# Patient Record
Sex: Female | Born: 1990 | Race: Black or African American | Hispanic: No | Marital: Single | Smoking: Current every day smoker
Health system: Southern US, Community
[De-identification: ages and names within clinical notes are randomized; demographics above are authoritative.]

## PROBLEM LIST (undated history)

## (undated) ENCOUNTER — Inpatient Hospital Stay (HOSPITAL_COMMUNITY): Payer: Self-pay

## (undated) DIAGNOSIS — J189 Pneumonia, unspecified organism: Secondary | ICD-10-CM

## (undated) DIAGNOSIS — J40 Bronchitis, not specified as acute or chronic: Secondary | ICD-10-CM

## (undated) DIAGNOSIS — J45909 Unspecified asthma, uncomplicated: Secondary | ICD-10-CM

## (undated) DIAGNOSIS — J302 Other seasonal allergic rhinitis: Secondary | ICD-10-CM

## (undated) HISTORY — PX: NO PAST SURGERIES: SHX2092

---
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
## 2008-01-12 ENCOUNTER — Emergency Department (HOSPITAL_COMMUNITY): Admission: EM | Admit: 2008-01-12 | Discharge: 2008-01-12 | Payer: Self-pay | Admitting: Family Medicine

## 2008-03-19 ENCOUNTER — Emergency Department (HOSPITAL_COMMUNITY): Admission: EM | Admit: 2008-03-19 | Discharge: 2008-03-19 | Payer: Self-pay | Admitting: Emergency Medicine

## 2008-05-14 ENCOUNTER — Emergency Department (HOSPITAL_COMMUNITY): Admission: EM | Admit: 2008-05-14 | Discharge: 2008-05-14 | Payer: Self-pay | Admitting: Emergency Medicine

## 2008-06-17 ENCOUNTER — Emergency Department (HOSPITAL_COMMUNITY): Admission: EM | Admit: 2008-06-17 | Discharge: 2008-06-17 | Payer: Self-pay | Admitting: Family Medicine

## 2008-11-09 ENCOUNTER — Emergency Department (HOSPITAL_COMMUNITY): Admission: EM | Admit: 2008-11-09 | Discharge: 2008-11-09 | Payer: Self-pay | Admitting: Family Medicine

## 2008-12-31 ENCOUNTER — Emergency Department (HOSPITAL_COMMUNITY): Admission: EM | Admit: 2008-12-31 | Discharge: 2008-12-31 | Payer: Self-pay | Admitting: Family Medicine

## 2009-07-22 ENCOUNTER — Emergency Department (HOSPITAL_COMMUNITY): Admission: EM | Admit: 2009-07-22 | Discharge: 2009-07-22 | Payer: Self-pay | Admitting: Emergency Medicine

## 2010-04-16 LAB — CULTURE, ROUTINE-ABSCESS

## 2010-04-24 LAB — POCT RAPID STREP A (OFFICE): Streptococcus, Group A Screen (Direct): NEGATIVE

## 2010-04-26 LAB — URINALYSIS, ROUTINE W REFLEX MICROSCOPIC
Glucose, UA: NEGATIVE mg/dL
Hgb urine dipstick: NEGATIVE
Protein, ur: NEGATIVE mg/dL
Urobilinogen, UA: 1 mg/dL (ref 0.0–1.0)

## 2010-10-19 LAB — POCT RAPID STREP A: Streptococcus, Group A Screen (Direct): NEGATIVE

## 2011-10-29 ENCOUNTER — Encounter (HOSPITAL_COMMUNITY): Payer: Self-pay | Admitting: Emergency Medicine

## 2011-10-29 ENCOUNTER — Emergency Department (INDEPENDENT_AMBULATORY_CARE_PROVIDER_SITE_OTHER)
Admission: EM | Admit: 2011-10-29 | Discharge: 2011-10-29 | Disposition: A | Discharge status: Home / Self Care | Payer: Self-pay | Source: Home / Self Care | Attending: Family Medicine | Admitting: Family Medicine

## 2011-10-29 DIAGNOSIS — Z720 Tobacco use: Secondary | ICD-10-CM

## 2011-10-29 DIAGNOSIS — J410 Simple chronic bronchitis: Secondary | ICD-10-CM

## 2011-10-29 HISTORY — DX: Other seasonal allergic rhinitis: J30.2

## 2011-10-29 HISTORY — DX: Bronchitis, not specified as acute or chronic: J40

## 2011-10-29 MED ORDER — AMOXICILLIN-POT CLAVULANATE 875-125 MG PO TABS: 1.0000 | ORAL_TABLET | Freq: Two times a day (BID) | ORAL | Status: DC

## 2011-10-29 MED ORDER — DEXTROMETHORPHAN POLISTIREX 30 MG/5ML PO LQCR: 60.0000 mg | Freq: Two times a day (BID) | ORAL | Status: DC

## 2011-10-29 NOTE — ED Notes (Signed)
C/o sore throat, difficulty swallowing, sniffles -started yesterday.  Patient reports a coughing episode followed by blood streaks in emesis.

## 2011-10-29 NOTE — ED Provider Notes (Signed)
History     CSN: [id number]  Arrival date & time 10/29/11  1607   First MD Initiated Contact with Patient 10/29/11 1748      Chief Complaint  Patient presents with  . URI    (Consider location/radiation/quality/duration/timing/severity/associated sxs/prior treatment) Patient is a 21 y.o. female presenting with URI. The history is provided by the patient.  URI The primary symptoms include cough and nausea. Primary symptoms do not include fever or rash. The current episode started yesterday. This is a new problem. The problem has not changed since onset. Associated with: smoking. Symptoms associated with the illness include congestion and rhinorrhea.    Past Medical History  Diagnosis Date  . Seasonal allergies   . Bronchitis     History reviewed. No pertinent past surgical history.  No family history on file.  History  Substance Use Topics  . Smoking status: Current Every Day Smoker  . Smokeless tobacco: Not on file  . Alcohol Use: Yes    OB History    Grav Para Term Preterm Abortions TAB SAB Ect Mult Living                  Review of Systems  Constitutional: Negative.  Negative for fever.  HENT: Positive for congestion, rhinorrhea and postnasal drip.   Respiratory: Positive for cough.   Cardiovascular: Negative.   Gastrointestinal: Positive for nausea.  Genitourinary: Negative.   Musculoskeletal: Negative.   Skin: Negative for rash.    Allergies  Review of patient's allergies indicates no known allergies.  Home Medications   Current Outpatient Rx  Name Route Sig Dispense Refill  . AMOXICILLIN-POT CLAVULANATE 875-125 MG PO TABS Oral Take 1 tablet by mouth 2 (two) times daily. 20 tablet 0  . DEXTROMETHORPHAN POLISTIREX ER 30 MG/5ML PO LQCR Oral Take 10 mLs (60 mg total) by mouth 2 (two) times daily. 89 mL 0    BP 122/76  Pulse 77  Temp 98.2 F (36.8 C) (Oral)  Resp 18  SpO2 100%  LMP 10/22/2011  Physical Exam  Nursing note and vitals  reviewed. Constitutional: She is oriented to person, place, and time. She appears well-developed and well-nourished.  HENT:  Head: Normocephalic.  Right Ear: External ear normal.  Left Ear: External ear normal.  Nose: Mucosal edema and rhinorrhea present.  Mouth/Throat: Oropharynx is clear and moist.  Neck: Normal range of motion. Neck supple.  Cardiovascular: Normal rate, regular rhythm, normal heart sounds and intact distal pulses.   Pulmonary/Chest: Effort normal. She has no decreased breath sounds. She has no wheezes. She has rhonchi. She has no rales.  Lymphadenopathy:    She has no cervical adenopathy.  Neurological: She is alert and oriented to person, place, and time.  Skin: Skin is warm and dry.  Psychiatric: She has a normal mood and affect.    ED Course  Procedures (including critical care time)  Labs Reviewed - No data to display No results found.   1. Bronchitis due to tobacco use       MDM          Linna Hoff, MD 10/29/11 1815

## 2011-12-25 ENCOUNTER — Encounter (HOSPITAL_COMMUNITY): Payer: Self-pay | Admitting: *Deleted

## 2011-12-25 ENCOUNTER — Inpatient Hospital Stay (HOSPITAL_COMMUNITY)
Admission: AD | Admit: 2011-12-25 | Discharge: 2011-12-25 | Disposition: A | Discharge status: Home / Self Care | Payer: Self-pay | Source: Ambulatory Visit | Attending: Obstetrics & Gynecology | Admitting: Obstetrics & Gynecology

## 2011-12-25 ENCOUNTER — Encounter (HOSPITAL_COMMUNITY): Payer: Self-pay

## 2011-12-25 ENCOUNTER — Inpatient Hospital Stay (HOSPITAL_COMMUNITY): Payer: Self-pay

## 2011-12-25 ENCOUNTER — Emergency Department (INDEPENDENT_AMBULATORY_CARE_PROVIDER_SITE_OTHER)
Admission: EM | Admit: 2011-12-25 | Discharge: 2011-12-25 | Disposition: A | Discharge status: Home / Self Care | Payer: Self-pay | Source: Home / Self Care | Attending: Family Medicine | Admitting: Family Medicine

## 2011-12-25 ENCOUNTER — Other Ambulatory Visit (HOSPITAL_COMMUNITY)
Admission: RE | Admit: 2011-12-25 | Discharge: 2011-12-25 | Disposition: A | Discharge status: Home / Self Care | Payer: Self-pay | Source: Ambulatory Visit | Attending: Family Medicine | Admitting: Family Medicine

## 2011-12-25 DIAGNOSIS — R109 Unspecified abdominal pain: Secondary | ICD-10-CM | POA: Insufficient documentation

## 2011-12-25 DIAGNOSIS — O200 Threatened abortion: Secondary | ICD-10-CM

## 2011-12-25 DIAGNOSIS — O209 Hemorrhage in early pregnancy, unspecified: Secondary | ICD-10-CM

## 2011-12-25 DIAGNOSIS — Z3490 Encounter for supervision of normal pregnancy, unspecified, unspecified trimester: Secondary | ICD-10-CM

## 2011-12-25 DIAGNOSIS — O26859 Spotting complicating pregnancy, unspecified trimester: Secondary | ICD-10-CM | POA: Insufficient documentation

## 2011-12-25 DIAGNOSIS — Z113 Encounter for screening for infections with a predominantly sexual mode of transmission: Secondary | ICD-10-CM | POA: Insufficient documentation

## 2011-12-25 LAB — CBC
HCT: 37.6 % (ref 36.0–46.0)
Hemoglobin: 12.5 g/dL (ref 12.0–15.0)
MCH: 30.7 pg (ref 26.0–34.0)
MCHC: 33.2 g/dL (ref 30.0–36.0)
MCV: 92.4 fL (ref 78.0–100.0)
Platelets: 339 10*3/uL (ref 150–400)
RBC: 4.07 MIL/uL (ref 3.87–5.11)
RDW: 12.2 % (ref 11.5–15.5)
WBC: 12.2 10*3/uL — ABNORMAL HIGH (ref 4.0–10.5)

## 2011-12-25 LAB — POCT PREGNANCY, URINE: Preg Test, Ur: POSITIVE — AB

## 2011-12-25 LAB — POCT URINALYSIS DIP (DEVICE)
Ketones, ur: NEGATIVE mg/dL
Nitrite: NEGATIVE
pH: 6 (ref 5.0–8.0)

## 2011-12-25 NOTE — ED Provider Notes (Signed)
History     CSN: [id number]  Arrival date & time 12/25/11  1111   First MD Initiated Contact with Patient 12/25/11 1135      Chief Complaint  Patient presents with  . Abdominal Cramping    (Consider location/radiation/quality/duration/timing/severity/associated sxs/prior treatment) HPI Comments: 21 year old, smoker female G1 P0. Denies prior history of sexually transmitted diseases. Here complaining of low abdominal cramping for 3 days. Symptoms associated with slight blood spotting since yesterday. Reports pain is mild to moderate 4-5/10. Not taking any medications for her symptoms. No dysuria or hematuria. Denies vaginal discharge. No fever or chills. No dizziness or headache. No vomiting. Reports history of irregular periods last menstrual period was mid November as per patient's recollection.   Past Medical History  Diagnosis Date  . Seasonal allergies   . Bronchitis     History reviewed. No pertinent past surgical history.  No family history on file.  History  Substance Use Topics  . Smoking status: Current Every Day Smoker  . Smokeless tobacco: Not on file  . Alcohol Use: Yes    OB History    Grav Para Term Preterm Abortions TAB SAB Ect Mult Living                  Review of Systems  Constitutional: Negative for fever, chills and fatigue.  Gastrointestinal: Positive for nausea. Negative for vomiting.  Genitourinary: Positive for vaginal bleeding and pelvic pain. Negative for dysuria, flank pain and vaginal discharge.  Neurological: Negative for dizziness.    Allergies  Review of patient's allergies indicates no known allergies.  Home Medications   Current Outpatient Rx  Name  Route  Sig  Dispense  Refill  . AMOXICILLIN-POT CLAVULANATE 875-125 MG PO TABS   Oral   Take 1 tablet by mouth 2 (two) times daily.   20 tablet   0   . DEXTROMETHORPHAN POLISTIREX ER 30 MG/5ML PO LQCR   Oral   Take 10 mLs (60 mg total) by mouth 2 (two) times daily.   89 mL   0     BP 122/72  Pulse 103  Temp 98.3 F (36.8 C) (Oral)  Resp 20  SpO2 100%  LMP 11/19/2011  Physical Exam  Nursing note and vitals reviewed. Constitutional: She is oriented to person, place, and time. She appears well-developed and well-nourished. No distress.       Appears comfortable. Able to transfer from chair to table with no obvious discomfort.  Neck: No thyromegaly present.  Cardiovascular: Normal heart sounds.        Mild tachycardia heart rate 90s to 103  Pulmonary/Chest: Effort normal and breath sounds normal. No respiratory distress.  Abdominal: Soft. She exhibits no distension. There is no rebound and no guarding.  Genitourinary:    Uterus is enlarged. Cervix exhibits no motion tenderness and no discharge. Right adnexum displays no mass, no tenderness and no fullness. Left adnexum displays no mass, no tenderness and no fullness. There is bleeding around the vagina. No vaginal discharge found.    Neurological: She is alert and oriented to person, place, and time.  Skin: She is not diaphoretic.    ED Course  Procedures (including critical care time)  Labs Reviewed  POCT PREGNANCY, URINE - Abnormal; Notable for the following:    Preg Test, Ur POSITIVE (*)     All other components within normal limits  POCT URINALYSIS DIP (DEVICE)   No results found.   1. First trimester bleeding  MDM  21 year old female G1 P0. Here with a positive pregnancy test and first trimester vaginal bleeding. Discussed with patient concerned about ectopic pregnancy versus other causes for early pregnancy bleeding. Patient is mildly tachycardic otherwise her blood pressure is stable. She was instructed to go to [facility] hospital from here. Case was discussed with nurse practitioner at maternity admission unit. GC and Chlamydia pending at the time of discharge.      Sharin Grave, MD 12/25/11 1333

## 2011-12-25 NOTE — Progress Notes (Signed)
Pt's  standing outside room door waiting for discharge.Pt  Informed that provider waiting for report.

## 2011-12-25 NOTE — MAU Note (Signed)
Patient states she was seen at Urgent Care today for cramping and spotting. Had a positive pregnancy test and sent to MAU for evaluation. Now having bad cramping and an increase in bleeding since the pelvic exam.

## 2011-12-25 NOTE — ED Notes (Signed)
Pt  Reports  Symptoms  Of  Low  abd  Cramping    Had        Slight   Blood  Tinged               Discharge         yest            She  Ambulated  Upright  With a  Steady  Fluid  Gait

## 2011-12-25 NOTE — MAU Provider Note (Signed)
History     CSN: [id number]  Arrival date and time: 12/25/11 1355   None     Chief Complaint  Patient presents with  . Abdominal Cramping   HPI This is a 21 y.o. female at [redacted]w[redacted]d by LMP who presents with c/o bleeding and cramps. Was seen at Urgent Care for same. Pelvic and cultures were done. She was sent here for [place] and bloodwork. Not sure she wants to continue pregnancy. Was using condoms and [facility]   OB History    Grav Para Term Preterm Abortions TAB SAB Ect Mult Living   1               Past Medical History  Diagnosis Date  . Seasonal allergies   . Bronchitis     Past Surgical History  Procedure Date  . No past surgeries     History reviewed. No pertinent family history.  History  Substance Use Topics  . Smoking status: Former Smoker    Quit date: 09/25/2011  . Smokeless tobacco: Former Neurosurgeon    Quit date: 11/25/2011  . Alcohol Use: 0.6 oz/week    1 Cans of beer per week    Allergies: No Known Allergies  No prescriptions prior to admission    ROS See HPI  Physical Exam   Blood pressure 119/84, pulse 105, temperature 98.1 F (36.7 C), temperature source Oral, resp. rate 16, height 5\' 5"  (1.651 m), weight 114 lb 3.2 oz (51.801 kg), last menstrual period 11/19/2011, SpO2 100.00%.  Physical Exam  Constitutional: She is oriented to person, place, and time. She appears well-developed and well-nourished.  Cardiovascular: Normal rate.   Respiratory: Effort normal.  GI: Soft. She exhibits no distension. There is no tenderness.  Genitourinary:       Deferred -- done at other center  Musculoskeletal: Normal range of motion.  Neurological: She is alert and oriented to person, place, and time.  Skin: Skin is warm and dry.  Psychiatric: She has a normal mood and affect.   Results for orders placed during the hospital encounter of 12/25/11 (from the past 24 hour(s))  HCG, QUANTITATIVE, PREGNANCY     Status:  Abnormal   Collection Time   12/25/11  2:26 PM      Component Value Range   hCG, Beta Chain, Quant, S 136 (*) <5 mIU/mL  ABO/RH     Status: Normal (Preliminary result)   Collection Time   12/25/11  2:26 PM      Component Value Range   ABO/RH(D) A POS    CBC     Status: Abnormal   Collection Time   12/25/11  2:26 PM      Component Value Range   WBC 12.2 (*) 4.0 - 10.5 K/uL   RBC 4.07  3.87 - 5.11 MIL/uL   Hemoglobin 12.5  12.0 - 15.0 g/dL   HCT 40.9  81.1 - 91.4 %   MCV 92.4  78.0 - 100.0 fL   MCH 30.7  26.0 - 34.0 pg   MCHC 33.2  30.0 - 36.0 g/dL   RDW 78.2  95.6 - 21.3 %   Platelets 339  150 - 400 K/uL     MAU Course  Procedures  MDM Quant, [place] >> As expected, [place] did not show any sign of pregnancy, not unexpected with low quant. Will repeat Quant in 48 hrs .  Requested proof of pregnancy letter "just in case"  Assessment and  Plan  A:  Pregnancy at [redacted]w[redacted]d  By LMP       Spotting and cramping      Cannot rule out ectopic yet  P:  Return in 48 hrs for quant HCG level       Repeat US in about 10 days, at which time, quant should be about 4000.         [facility] 12/25/2011, 2:49 PM

## 2011-12-26 NOTE — MAU Provider Note (Signed)
Attestation of Attending Supervision of Advanced Practitioner (PA/CNM/NP): Evaluation and management procedures were performed by the Advanced Practitioner under my supervision and collaboration.  I have reviewed the Advanced Practitioner's note and chart, and I agree with the management and plan.  Annaleise Burger, MD, FACOG Attending Obstetrician & Gynecologist Faculty Practice, [facility]  

## 2011-12-28 ENCOUNTER — Inpatient Hospital Stay (HOSPITAL_COMMUNITY)
Admission: AD | Admit: 2011-12-28 | Discharge: 2011-12-28 | Disposition: A | Discharge status: Home / Self Care | Payer: Self-pay | Source: Ambulatory Visit | Attending: Obstetrics and Gynecology | Admitting: Obstetrics and Gynecology

## 2011-12-28 ENCOUNTER — Encounter (HOSPITAL_COMMUNITY): Payer: Self-pay

## 2011-12-28 DIAGNOSIS — O039 Complete or unspecified spontaneous abortion without complication: Secondary | ICD-10-CM | POA: Insufficient documentation

## 2011-12-28 LAB — URINALYSIS, ROUTINE W REFLEX MICROSCOPIC: Specific Gravity, Urine: >1.03 — ABNORMAL HIGH (ref 1.005–1.030)

## 2011-12-28 LAB — CBC
MCH: 31 pg (ref 26.0–34.0)
MCV: 92.3 fL (ref 78.0–100.0)
Platelets: 369 10*3/uL (ref 150–400)
RDW: 12.2 % (ref 11.5–15.5)

## 2011-12-28 LAB — URINE MICROSCOPIC-ADD ON

## 2011-12-28 LAB — HCG, QUANTITATIVE, PREGNANCY: hCG, Beta Chain, Quant, S: 30 m[IU]/mL — ABNORMAL HIGH (ref <5)

## 2011-12-28 MED ORDER — IBUPROFEN 200 MG PO TABS: 600.0000 mg | ORAL_TABLET | Freq: Four times a day (QID) | ORAL | Status: DC | PRN

## 2011-12-28 NOTE — MAU Note (Signed)
Pt reports she started having bad abd cramps and also having heavy vag bleeding this morning. Still having some bleeding now. Was seen in MAU a few dayd ago and told  The u/s did not see a pregnancy.

## 2011-12-28 NOTE — MAU Provider Note (Signed)
History     CSN: [id number]  Arrival date and time: 12/28/11 1816   None     Chief Complaint  Patient presents with  . Vaginal Bleeding   HPI 21 y.o. G1P0 at [redacted]w[redacted]d with vaginal bleeding and cramping 2 days ago, increased this morning. U/S here 2 days ago showed no IUP or ectopic, quant 130 at that time.    Past Medical History  Diagnosis Date  . Seasonal allergies   . Bronchitis     Past Surgical History  Procedure Date  . No past surgeries     Family History  Problem Relation Age of Onset  . Other Neg Hx     History  Substance Use Topics  . Smoking status: Former Smoker    Quit date: 09/25/2011  . Smokeless tobacco: Former Neurosurgeon    Quit date: 11/25/2011  . Alcohol Use: 0.6 oz/week    1 Cans of beer per week    Allergies:  Allergies  Allergen Reactions  . Benadryl (Diphenhydramine Hcl) Swelling    Prescriptions prior to admission  Medication Sig Dispense Refill  . HYDROcodone-acetaminophen (VICODIN) 2.5-500 MG per tablet Take 1 tablet by mouth every 6 (six) hours as needed.      Marland Kitchen ibuprofen (ADVIL,MOTRIN) 200 MG tablet Take 600 mg by mouth every 6 (six) hours as needed.        Review of Systems  Constitutional: Negative.   Respiratory: Negative.   Cardiovascular: Negative.   Gastrointestinal: Positive for abdominal pain. Negative for nausea, vomiting, diarrhea and constipation.  Genitourinary: Negative for dysuria, urgency, frequency, hematuria and flank pain.       + vaginal bleeding   Musculoskeletal: Negative.   Neurological: Negative.   Psychiatric/Behavioral: Negative.    Physical Exam   Blood pressure 128/82, pulse 105, temperature 98.2 F (36.8 C), temperature source Oral, resp. rate 18, height 5\' 5"  (1.651 m), weight 114 lb (51.71 kg), last menstrual period 11/19/2011.  Physical Exam  Nursing note and vitals reviewed. Constitutional: She is oriented to person, place, and time. She appears well-developed and well-nourished. No distress.   Cardiovascular: Normal rate.   Respiratory: Effort normal.  GI: Soft. There is no tenderness.  Genitourinary:       Small vaginal bleeding on pad   Musculoskeletal: Normal range of motion.  Neurological: She is alert and oriented to person, place, and time.  Skin: Skin is warm and dry.  Psychiatric: She has a normal mood and affect.    MAU Course  Procedures  Results for orders placed during the hospital encounter of 12/28/11 (from the past 24 hour(s))  URINALYSIS, ROUTINE W REFLEX MICROSCOPIC     Status: Abnormal   Collection Time   12/28/11  7:10 PM      Component Value Range   Color, Urine RED (*) YELLOW   APPearance CLEAR  CLEAR   Specific Gravity, Urine >1.030 (*) 1.005 - 1.030   pH 5.5  5.0 - 8.0   Glucose, UA 100 (*) NEGATIVE mg/dL   Hgb urine dipstick LARGE (*) NEGATIVE   Bilirubin Urine SMALL (*) NEGATIVE   Ketones, ur 15 (*) NEGATIVE mg/dL   Protein, ur >409 (*) NEGATIVE mg/dL   Urobilinogen, UA 1.0  0.0 - 1.0 mg/dL   Nitrite POSITIVE (*) NEGATIVE   Leukocytes, UA TRACE (*) NEGATIVE  URINE MICROSCOPIC-ADD ON     Status: Abnormal   Collection Time   12/28/11  7:10 PM      Component Value Range  Squamous Epithelial / LPF FEW (*) RARE   WBC, UA 3-6  <3 WBC/hpf   RBC / HPF TOO NUMEROUS TO COUNT  <3 RBC/hpf   Bacteria, UA MANY (*) RARE  HCG, QUANTITATIVE, PREGNANCY     Status: Abnormal   Collection Time   12/28/11  7:47 PM      Component Value Range   hCG, Beta Chain, Quant, S 30 (*) <5 mIU/mL  CBC     Status: Abnormal   Collection Time   12/28/11  7:47 PM      Component Value Range   WBC 11.7 (*) 4.0 - 10.5 K/uL   RBC 4.03  3.87 - 5.11 MIL/uL   Hemoglobin 12.5  12.0 - 15.0 g/dL   HCT 16.1  09.6 - 04.5 %   MCV 92.3  78.0 - 100.0 fL   MCH 31.0  26.0 - 34.0 pg   MCHC 33.6  30.0 - 36.0 g/dL   RDW 40.9  81.1 - 91.4 %   Platelets 369  150 - 400 K/uL     Assessment and Plan   1. Miscarriage   Quant HCG decreased from 130 to 30 in 3 days. Rev'd  precautions. F/U 1 week for repeat quant.     Medication List     As of 12/28/2011 11:07 PM    CONTINUE taking these medications         HYDROcodone-acetaminophen 2.5-500 MG per tablet   Commonly known as: VICODIN      ibuprofen 200 MG tablet   Commonly known as: ADVIL,MOTRIN   Take 3 tablets (600 mg total) by mouth every 6 (six) hours as needed.          Where to get your medications    These are the prescriptions that you need to pick up. We sent them to a specific pharmacy, so you will need to go there to get them.   [facility] - [place],  - [address]    [address] [place] [address]    Phone: [phone]        ibuprofen 200 MG tablet            Follow-up Information    Follow up with [facility]. In 1 week. (repeat labs)    Contact information:   [address] [id number] mc [place] [address] [phone]           Tephanie Escorcia 12/28/2011, 8:27 PM

## 2011-12-30 LAB — URINE CULTURE: Colony Count: NO GROWTH

## 2011-12-31 NOTE — MAU Provider Note (Signed)
Attestation of Attending Supervision of Advanced Practitioner (CNM/NP): Evaluation and management procedures were performed by the Advanced Practitioner under my supervision and collaboration.  I have reviewed the Advanced Practitioner's note and chart, and I agree with the management and plan.  Nykira Reddix 12/31/2011 5:48 PM

## 2012-10-29 ENCOUNTER — Encounter (HOSPITAL_COMMUNITY): Payer: Self-pay | Admitting: *Deleted

## 2013-11-15 ENCOUNTER — Encounter (HOSPITAL_COMMUNITY): Payer: Self-pay | Admitting: *Deleted

## 2016-05-19 ENCOUNTER — Emergency Department (HOSPITAL_BASED_OUTPATIENT_CLINIC_OR_DEPARTMENT_OTHER)
Admission: EM | Admit: 2016-05-19 | Discharge: 2016-05-19 | Disposition: A | Discharge status: Home / Self Care | Payer: Self-pay | Attending: Emergency Medicine | Admitting: Emergency Medicine

## 2016-05-19 ENCOUNTER — Encounter (HOSPITAL_BASED_OUTPATIENT_CLINIC_OR_DEPARTMENT_OTHER): Payer: Self-pay | Admitting: *Deleted

## 2016-05-19 ENCOUNTER — Emergency Department (HOSPITAL_BASED_OUTPATIENT_CLINIC_OR_DEPARTMENT_OTHER): Payer: Self-pay

## 2016-05-19 DIAGNOSIS — Z3201 Encounter for pregnancy test, result positive: Secondary | ICD-10-CM

## 2016-05-19 DIAGNOSIS — O99331 Smoking (tobacco) complicating pregnancy, first trimester: Secondary | ICD-10-CM | POA: Insufficient documentation

## 2016-05-19 DIAGNOSIS — R102 Pelvic and perineal pain: Secondary | ICD-10-CM | POA: Insufficient documentation

## 2016-05-19 DIAGNOSIS — O209 Hemorrhage in early pregnancy, unspecified: Secondary | ICD-10-CM | POA: Insufficient documentation

## 2016-05-19 DIAGNOSIS — Z3A01 Less than 8 weeks gestation of pregnancy: Secondary | ICD-10-CM | POA: Insufficient documentation

## 2016-05-19 DIAGNOSIS — O99321 Drug use complicating pregnancy, first trimester: Secondary | ICD-10-CM | POA: Insufficient documentation

## 2016-05-19 DIAGNOSIS — N939 Abnormal uterine and vaginal bleeding, unspecified: Secondary | ICD-10-CM

## 2016-05-19 DIAGNOSIS — F17210 Nicotine dependence, cigarettes, uncomplicated: Secondary | ICD-10-CM | POA: Insufficient documentation

## 2016-05-19 DIAGNOSIS — F1290 Cannabis use, unspecified, uncomplicated: Secondary | ICD-10-CM | POA: Insufficient documentation

## 2016-05-19 LAB — URINALYSIS, ROUTINE W REFLEX MICROSCOPIC
Glucose, UA: NEGATIVE mg/dL
Ketones, ur: 15 mg/dL — AB
Leukocytes, UA: NEGATIVE
NITRITE: NEGATIVE
PH: 6 (ref 5.0–8.0)
Protein, ur: NEGATIVE mg/dL
SPECIFIC GRAVITY, URINE: 1.027 (ref 1.005–1.030)

## 2016-05-19 LAB — CBC
HCT: 39.7 % (ref 36.0–46.0)
HEMOGLOBIN: 14 g/dL (ref 12.0–15.0)
MCH: 32.3 pg (ref 26.0–34.0)
MCHC: 35.3 g/dL (ref 30.0–36.0)
MCV: 91.7 fL (ref 78.0–100.0)
PLATELETS: 368 10*3/uL (ref 150–400)
RBC: 4.33 MIL/uL (ref 3.87–5.11)
RDW: 11.8 % (ref 11.5–15.5)
WBC: 14.4 10*3/uL — ABNORMAL HIGH (ref 4.0–10.5)

## 2016-05-19 LAB — PREGNANCY, URINE: Preg Test, Ur: POSITIVE — AB

## 2016-05-19 LAB — HCG, QUANTITATIVE, PREGNANCY: HCG, BETA CHAIN, QUANT, S: 12 m[IU]/mL — AB (ref <5)

## 2016-05-19 LAB — URINALYSIS, MICROSCOPIC (REFLEX)

## 2016-05-19 NOTE — ED Triage Notes (Addendum)
Pt reports irregular periods since last month -- reports excessive bleeding (birth control method is condoms). Reports approx 6pads/24hrs. Denies fever, urinary symptoms, recent childbirth, n/v, dizziness.

## 2016-05-19 NOTE — ED Notes (Signed)
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 Had ?periods x 2 last month. Started bleeding again this month after intercourse. Used 5-6 regular pads yesterday and 2 today. Denies other s/s.

## 2016-05-19 NOTE — ED Notes (Signed)
Not in room, pt in US.  

## 2016-05-19 NOTE — ED Notes (Signed)
Pt given d/c instructions as per chart. Verbalizes understanding. No questions. 

## 2016-05-19 NOTE — ED Notes (Signed)
Pt given crackers, cheese, and water. MD approved.

## 2016-05-19 NOTE — ED Provider Notes (Signed)
[facility] Provider Note   CSN: [id number] Arrival date & time: 05/19/16  1650  By signing my name below, I, Nancy Michael, attest that this documentation has been prepared under the direction and in the presence of Arnelle Nale, Ambrose Finland, MD. Electronically Signed: Rosario Michael, ED Scribe. 05/19/16. 7:40 PM.  History   Chief Complaint Chief Complaint  Patient presents with  . Vaginal Bleeding   The history is provided by the patient. No language interpreter was used.    HPI Comments: Nancy Michael is a G25P0010 26 y.o. female with a h/o prior ectopic pregnancy but otherwise healthy, who presents to the Emergency Department complaining of intermittent and irregular vaginal bleeding beginning last month. Per pt, she last had a normal menstrual cycle last month, however, shortly following she had further irregular bleeding and has had two more episodes of this since. She denies any vaginal discharge prior to her irregular bleeding. She states that she has used approximately 6 pads/24hrs and additionally reports some cramping suprapubic abdominal pain yesterday, but this has improved today. No h/o similar symptoms. Pt is currently sexually active. Pt is not currently on OCP but she does use condoms. She denies nausea, vomiting, dysuria, dizziness/light-headedness, or any other associated symptoms.   Past Medical History:  Diagnosis Date  . Bronchitis   . Seasonal allergies    There are no active problems to display for this patient.  Past Surgical History:  Procedure Laterality Date  . NO PAST SURGERIES     OB History    Gravida Para Term Preterm AB Living   1             SAB TAB Ectopic Multiple Live Births                 Home Medications    Prior to Admission medications   Medication Sig Start Date End Date Taking? Authorizing Provider  HYDROcodone-acetaminophen (VICODIN) 2.5-500 MG per tablet Take 1 tablet by mouth every 6 (six) hours as needed.     [provider]  ibuprofen (ADVIL,MOTRIN) 200 MG tablet Take 3 tablets (600 mg total) by mouth every 6 (six) hours as needed. 12/28/11   Archie Patten, CNM   Family History Family History  Problem Relation Age of Onset  . Other Neg Hx    Social History Social History  Substance Use Topics  . Smoking status: Current Some Day Smoker    Types: Cigarettes    Last attempt to quit: 09/25/2011  . Smokeless tobacco: Former Neurosurgeon    Quit date: 11/25/2011  . Alcohol use 0.6 oz/week    1 Cans of beer per week   Allergies   Benadryl [diphenhydramine hcl]  Review of Systems Review of Systems A complete review of systems was obtained and all systems are negative except as noted in the HPI and PMH.   Physical Exam Updated Vital Signs BP (!) 127/100   Pulse 83   Temp 98.3 F (36.8 C) (Oral)   Resp 16   Ht 5\' 5"  (1.651 m)   Wt 103 lb 14.4 oz (47.1 kg)   LMP 05/18/2016 (Exact Date)   SpO2 100%   BMI 17.29 kg/m   Physical Exam  Constitutional: She is oriented to person, place, and time. She appears well-developed and well-nourished. No distress.  HENT:  Head: Normocephalic and atraumatic.  Moist mucous membranes  Eyes: Conjunctivae are normal. Pupils are equal, round, and reactive to light.  Neck: Neck supple.  Cardiovascular: Normal rate, regular rhythm and normal heart sounds.   No murmur heard. Pulmonary/Chest: Effort normal and breath sounds normal.  Abdominal: Soft. Bowel sounds are normal. She exhibits no distension. There is no tenderness.  Genitourinary:  Genitourinary Comments: Chaperone present throughout entire exam. Moderate amount of blood in the vaginal vault. No cervical motion or adnexal tenderness.   Musculoskeletal: She exhibits no edema.  Neurological: She is alert and oriented to person, place, and time.  Fluent speech  Skin: Skin is warm and dry.  Psychiatric: She has a normal mood and affect. Judgment normal.  Nursing note and vitals  reviewed.  ED Treatments / Results  DIAGNOSTIC STUDIES: Oxygen Saturation is 100% on RA, normal by my interpretation.   COORDINATION OF CARE: 7:40 PM-Discussed next steps with pt. Pt verbalized understanding and is agreeable with the plan.   Labs (all labs ordered are listed, but only abnormal results are displayed) Labs Reviewed  PREGNANCY, URINE - Abnormal; Notable for the following:       Result Value   Preg Test, Ur WEAKLY POSITIVE (*)    All other components within normal limits  URINALYSIS, ROUTINE W REFLEX MICROSCOPIC - Abnormal; Notable for the following:    Color, Urine AMBER (*)    Hgb urine dipstick MODERATE (*)    Bilirubin Urine SMALL (*)    Ketones, ur 15 (*)    All other components within normal limits  URINALYSIS, MICROSCOPIC (REFLEX) - Abnormal; Notable for the following:    Bacteria, UA FEW (*)    Squamous Epithelial / LPF 0-5 (*)    All other components within normal limits  HCG, QUANTITATIVE, PREGNANCY - Abnormal; Notable for the following:    hCG, Beta Chain, Quant, S 12 (*)    All other components within normal limits  CBC - Abnormal; Notable for the following:    WBC 14.4 (*)    All other components within normal limits  GC/CHLAMYDIA PROBE AMP ([place]) NOT AT [facility]   EKG  EKG Interpretation None      Radiology [place] Ob Comp < 14 Wks  Result Date: 05/19/2016 CLINICAL DATA:  Intermittent pelvic cramping for 1 day. Vaginal bleeding for 3 days. Three weeks 0 days pregnant by last menstrual. EXAM: OBSTETRIC <14 WK US AND TRANSVAGINAL OB US TECHNIQUE: Both transabdominal and transvaginal ultrasound examinations were performed for complete evaluation of the gestation as well as the maternal uterus, adnexal regions, and pelvic cul-de-sac. Transvaginal technique was performed to assess early pregnancy. COMPARISON:  12/25/2011 FINDINGS: Intrauterine gestational sac: Absent Yolk sac:  Absent Embryo:  Absent Subchorionic hemorrhage:  None visualized. Maternal  uterus/adnexae: No endometrial thickening or fluid identified. Probable nabothian cysts in the cervix. A right ovarian hypoechoic 1.8 cm lesion is most likely a corpus luteal cyst. Normal left ovarian morphology. No significant free fluid. IMPRESSION: 1. Lack of intrauterine gestational sac, yolk sac, or fetal pole. Given early gestational age by last menstrual, this most likely represents an early intrauterine pregnancy. Missed abortion or otherwise occult ectopic pregnancy could look similar. Consider short term follow-up with beta HCG level and possibly ultrasound. 2. Probable right ovarian corpus luteal cyst. Electronically Signed   By: Jeronimo GreavesKyle  Talbot M.D.   On: 05/19/2016 20:39   [place] Ob Transvaginal  Result Date: 05/19/2016 CLINICAL DATA:  Intermittent pelvic cramping for 1 day. Vaginal bleeding for 3 days. Three weeks 0 days pregnant by last menstrual. EXAM: OBSTETRIC <14 WK US AND TRANSVAGINAL OB US TECHNIQUE: Both transabdominal and transvaginal  ultrasound examinations were performed for complete evaluation of the gestation as well as the maternal uterus, adnexal regions, and pelvic cul-de-sac. Transvaginal technique was performed to assess early pregnancy. COMPARISON:  12/25/2011 FINDINGS: Intrauterine gestational sac: Absent Yolk sac:  Absent Embryo:  Absent Subchorionic hemorrhage:  None visualized. Maternal uterus/adnexae: No endometrial thickening or fluid identified. Probable nabothian cysts in the cervix. A right ovarian hypoechoic 1.8 cm lesion is most likely a corpus luteal cyst. Normal left ovarian morphology. No significant free fluid. IMPRESSION: 1. Lack of intrauterine gestational sac, yolk sac, or fetal pole. Given early gestational age by last menstrual, this most likely represents an early intrauterine pregnancy. Missed abortion or otherwise occult ectopic pregnancy could look similar. Consider short term follow-up with beta HCG level and possibly ultrasound. 2. Probable right ovarian corpus  luteal cyst. Electronically Signed   By: Jeronimo Greaves M.D.   On: 05/19/2016 20:39    Procedures Procedures  Medications Ordered in ED Medications - No data to display  Initial Impression / Assessment and Plan / ED Course  I have reviewed the triage vital signs and the nursing notes.  Pertinent labs & imaging results that were available during my care of the patient were reviewed by me and considered in my medical decision making (see chart for details).     Pt presenting with irregular bleeding for the past few weeks, last normal period was one month ago. She was comfortable on exam with reassuring vital signs, no abdominal tenderness. Moderate amount of blood on pelvic exam without any signs or symptoms of PID. Obtained above labs which shows weakly positive pregnancy test with hCG 12. Obtained ultrasound to evaluate for possible ectopic pregnancy given painless vaginal bleeding. Ultrasound showed no gestational sac, yolk sac, or fetal pole. Differential includes missed abortion, early IUP, or early ectopic pregnancy. Discussed with OB/GYN on-call, Dr. Emelda Fear, appreciate his assistance. He will assist with 48 hour follow-up for repeat beta hCG. I have discussed this follow-up plan with the patient and emphasized the importance of obtaining this lab work due to her risk for ectopic pregnancy. She understands this plan as well as return precautions. Instructed her to report directly to [facility] should any complications occur before her follow-up appointment. She voiced understanding and was discharged in satisfactory condition.  Final Clinical Impressions(s) / ED Diagnoses   Final diagnoses:  Vaginal bleeding  Positive pregnancy test   New Prescriptions Discharge Medication List as of 05/19/2016 10:00 PM     I personally performed the services described in this documentation, which was scribed in my presence. The recorded information has been reviewed and is accurate.       Amoree Newlon, Ambrose Finland, MD 05/20/16 [phone]

## 2016-05-20 LAB — GC/CHLAMYDIA PROBE AMP (~~LOC~~) NOT AT ARMC
CHLAMYDIA, DNA PROBE: NEGATIVE
Neisseria Gonorrhea: NEGATIVE

## 2016-05-22 ENCOUNTER — Other Ambulatory Visit: Payer: Medicaid Other

## 2016-05-22 DIAGNOSIS — O3680X0 Pregnancy with inconclusive fetal viability, not applicable or unspecified: Secondary | ICD-10-CM

## 2016-05-23 LAB — BETA HCG QUANT (REF LAB): hCG Quant: 3 m[IU]/mL

## 2016-05-27 ENCOUNTER — Telehealth: Payer: Self-pay

## 2016-05-27 NOTE — Telephone Encounter (Signed)
Patient has been informed of complete SAB and the need to follow up with our office in two weeks. Patient plans to scheduled follow up.

## 2016-05-27 NOTE — Telephone Encounter (Signed)
-----   Message from Marny LowensteinJulie N Wenzel, PA-C sent at 05/23/2016  1:19 PM EDT ----- Please inform patient of complete SAB. It appears based on results from recent ED visit that she has had bleeding off and on for weeks. Her beta hCG that day was 12 and yesterday it was 3 which is normal for non-pregnant. Please advise patient to use condoms and make appointment to see a provider in our office in 2 weeks for follow-up since she only had labs yesterday.   Thanks,  Raynelle FanningJulie

## 2016-06-03 ENCOUNTER — Telehealth (HOSPITAL_COMMUNITY): Payer: Self-pay | Admitting: Emergency Medicine

## 2016-09-19 ENCOUNTER — Inpatient Hospital Stay (HOSPITAL_COMMUNITY)
Admission: AD | Admit: 2016-09-19 | Discharge: 2016-09-19 | Disposition: A | Discharge status: Home / Self Care | Payer: Medicaid Other | Source: Ambulatory Visit | Attending: Obstetrics and Gynecology | Admitting: Obstetrics and Gynecology

## 2016-09-19 ENCOUNTER — Encounter (HOSPITAL_COMMUNITY): Payer: Self-pay | Admitting: *Deleted

## 2016-09-19 ENCOUNTER — Ambulatory Visit: Payer: Self-pay

## 2016-09-19 ENCOUNTER — Inpatient Hospital Stay (HOSPITAL_COMMUNITY): Payer: Medicaid Other

## 2016-09-19 DIAGNOSIS — O99331 Smoking (tobacco) complicating pregnancy, first trimester: Secondary | ICD-10-CM | POA: Insufficient documentation

## 2016-09-19 DIAGNOSIS — F17210 Nicotine dependence, cigarettes, uncomplicated: Secondary | ICD-10-CM | POA: Diagnosis not present

## 2016-09-19 DIAGNOSIS — Z3A01 Less than 8 weeks gestation of pregnancy: Secondary | ICD-10-CM | POA: Diagnosis not present

## 2016-09-19 DIAGNOSIS — R109 Unspecified abdominal pain: Secondary | ICD-10-CM | POA: Diagnosis present

## 2016-09-19 DIAGNOSIS — Z3491 Encounter for supervision of normal pregnancy, unspecified, first trimester: Secondary | ICD-10-CM

## 2016-09-19 DIAGNOSIS — O26891 Other specified pregnancy related conditions, first trimester: Secondary | ICD-10-CM

## 2016-09-19 DIAGNOSIS — O26899 Other specified pregnancy related conditions, unspecified trimester: Secondary | ICD-10-CM

## 2016-09-19 LAB — CBC
HCT: 37.3 % (ref 36.0–46.0)
Hemoglobin: 12.8 g/dL (ref 12.0–15.0)
MCH: 31.8 pg (ref 26.0–34.0)
MCHC: 34.3 g/dL (ref 30.0–36.0)
MCV: 92.8 fL (ref 78.0–100.0)
Platelets: 403 10*3/uL — ABNORMAL HIGH (ref 150–400)
RBC: 4.02 MIL/uL (ref 3.87–5.11)
RDW: 12.4 % (ref 11.5–15.5)
WBC: 14 10*3/uL — ABNORMAL HIGH (ref 4.0–10.5)

## 2016-09-19 LAB — WET PREP, GENITAL
SPERM: NONE SEEN
TRICH WET PREP: NONE SEEN
WBC WET PREP: NONE SEEN
YEAST WET PREP: NONE SEEN

## 2016-09-19 LAB — URINALYSIS, ROUTINE W REFLEX MICROSCOPIC
Bilirubin Urine: NEGATIVE
Glucose, UA: NEGATIVE mg/dL
Hgb urine dipstick: NEGATIVE
Ketones, ur: 20 mg/dL — AB
LEUKOCYTES UA: NEGATIVE
NITRITE: NEGATIVE
PH: 6 (ref 5.0–8.0)
Protein, ur: NEGATIVE mg/dL
SPECIFIC GRAVITY, URINE: 1.013 (ref 1.005–1.030)

## 2016-09-19 LAB — HCG, QUANTITATIVE, PREGNANCY: HCG, BETA CHAIN, QUANT, S: 75450 m[IU]/mL — AB (ref <5)

## 2016-09-19 LAB — POCT PREGNANCY, URINE: PREG TEST UR: POSITIVE — AB

## 2016-09-19 NOTE — MAU Provider Note (Signed)
History     CSN: [id number]  Arrival date and time: 09/19/16 1530   First Provider Initiated Contact with Patient 09/19/16 1609      Chief Complaint  Patient presents with  . Abdominal Cramping   G3P0020  by unsure LMP here with lower abdominal cramping x3 hrs. Pain is central in low abdomen, rates 5/10. Did not use anything for the pain. No fever. No urinary sx. No VB or vaginal discharge. No new partner in "years". Hx of ectopic and SAB. Had positive HPT 4 days ago.     OB History    Gravida Para Term Preterm AB Living   3       2 0   SAB TAB Ectopic Multiple Live Births   1   1   0      Past Medical History:  Diagnosis Date  . Bronchitis   . Seasonal allergies     Past Surgical History:  Procedure Laterality Date  . NO PAST SURGERIES      Family History  Problem Relation Age of Onset  . Other Neg Hx     Social History  Substance Use Topics  . Smoking status: Current Some Day Smoker    Packs/day: 0.25    Types: Cigarettes    Last attempt to quit: 09/25/2011  . Smokeless tobacco: Former Neurosurgeon    Quit date: 11/25/2011  . Alcohol use 0.6 oz/week    1 Cans of beer per week    Allergies:  Allergies  Allergen Reactions  . Benadryl [Diphenhydramine Hcl] Swelling    Prescriptions Prior to Admission  Medication Sig Dispense Refill Last Dose  . Prenatal Vit-Fe Fumarate-FA (PRENATAL MULTIVITAMIN) TABS tablet Take 1 tablet by mouth daily at 12 noon.   09/18/2016 at Unknown time  . HYDROcodone-acetaminophen (VICODIN) 2.5-500 MG per tablet Take 1 tablet by mouth every 6 (six) hours as needed.   12/28/2011 at 1300  . ibuprofen (ADVIL,MOTRIN) 200 MG tablet Take 3 tablets (600 mg total) by mouth every 6 (six) hours as needed. 30 tablet 0     Review of Systems  Constitutional: Negative for fever.  Gastrointestinal: Positive for abdominal pain. Negative for constipation, diarrhea, nausea and vomiting.  Genitourinary: Negative for dysuria, frequency, vaginal  bleeding and vaginal discharge.   Physical Exam   Blood pressure 111/81, pulse 85, temperature 98.6 F (37 C), resp. rate 18, height 5' 5.5" (1.664 m), weight 98 lb (44.5 kg), last menstrual period 07/28/2016, unknown if currently breastfeeding.  Physical Exam  Constitutional: She is oriented to person, place, and time. She appears well-developed and well-nourished.  HENT:  Head: Normocephalic and atraumatic.  Neck: Normal range of motion.  Cardiovascular: Normal rate.   Respiratory: Effort normal. No respiratory distress.  GI: Soft. She exhibits no distension and no mass. There is no tenderness. There is no rebound and no guarding.  Genitourinary:  Genitourinary Comments: External: no lesions or erythema Vagina: rugated, pink, moist, small thin white discharge Uterus: non enlarged, anteverted, non tender, no CMT Adnexae: no masses, no tenderness left, no tenderness right   Musculoskeletal: Normal range of motion.  Neurological: She is alert and oriented to person, place, and time.  Skin: Skin is warm and dry.  Psychiatric: She has a normal mood and affect.   Results for orders placed or performed during the hospital encounter of 09/19/16 (from the past 24 hour(s))  Urinalysis, Routine w reflex microscopic     Status: Abnormal   Collection Time: 09/19/16  3:50  PM  Result Value Ref Range   Color, Urine YELLOW YELLOW   APPearance HAZY (A) CLEAR   Specific Gravity, Urine 1.013 1.005 - 1.030   pH 6.0 5.0 - 8.0   Glucose, UA NEGATIVE NEGATIVE mg/dL   Hgb urine dipstick NEGATIVE NEGATIVE   Bilirubin Urine NEGATIVE NEGATIVE   Ketones, ur 20 (A) NEGATIVE mg/dL   Protein, ur NEGATIVE NEGATIVE mg/dL   Nitrite NEGATIVE NEGATIVE   Leukocytes, UA NEGATIVE NEGATIVE  Pregnancy, urine POC     Status: Abnormal   Collection Time: 09/19/16  4:01 PM  Result Value Ref Range   Preg Test, Ur POSITIVE (A) NEGATIVE  Wet prep, genital     Status: Abnormal   Collection Time: 09/19/16  4:12 PM   Result Value Ref Range   Yeast Wet Prep HPF POC NONE SEEN NONE SEEN   Trich, Wet Prep NONE SEEN NONE SEEN   Clue Cells Wet Prep HPF POC PRESENT (A) NONE SEEN   WBC, Wet Prep HPF POC NONE SEEN NONE SEEN   Sperm NONE SEEN   CBC     Status: Abnormal   Collection Time: 09/19/16  4:21 PM  Result Value Ref Range   WBC 14.0 (H) 4.0 - 10.5 K/uL   RBC 4.02 3.87 - 5.11 MIL/uL   Hemoglobin 12.8 12.0 - 15.0 g/dL   HCT 69.6 29.5 - 28.4 %   MCV 92.8 78.0 - 100.0 fL   MCH 31.8 26.0 - 34.0 pg   MCHC 34.3 30.0 - 36.0 g/dL   RDW 13.2 44.0 - 10.2 %   Platelets 403 (H) 150 - 400 K/uL  hCG, quantitative, pregnancy     Status: Abnormal   Collection Time: 09/19/16  4:21 PM  Result Value Ref Range   hCG, Beta Chain, Quant, S 75,450 (H) <5 mIU/mL   US Ob Comp Less 14 Wks  Result Date: 09/19/2016 CLINICAL DATA:  Cramping in early pregnancy. EXAM: OBSTETRIC <14 WK [place] AND TRANSVAGINAL OB US TECHNIQUE: Both transabdominal and transvaginal ultrasound examinations were performed for complete evaluation of the gestation as well as the maternal uterus, adnexal regions, and pelvic cul-de-sac. Transvaginal technique was performed to assess early pregnancy. COMPARISON:  None. FINDINGS: Intrauterine gestational sac: Single Yolk sac:  Visualized. Embryo:  Visualized. Cardiac Activity: Visualized. Heart Rate: 124  bpm MSD:   mm    w     d CRL:  8  mm   6 w   4 d                  [place] EDC: May 11, 2017 Subchorionic hemorrhage:  None visualized. Maternal uterus/adnexae: Normal ovaries with a corpus luteum cyst on the left. There is a small amount of fluid in the cul-de-sac. IMPRESSION: 1. Single live IUP. 2. Physiologic fluid in the pelvis. Electronically Signed   By: Gerome Sam III M.D   On: 09/19/2016 18:43   US Ob Transvaginal  Result Date: 09/19/2016 CLINICAL DATA:  Cramping in early pregnancy. EXAM: OBSTETRIC <14 WK [place] AND TRANSVAGINAL OB US TECHNIQUE: Both transabdominal and transvaginal ultrasound examinations were  performed for complete evaluation of the gestation as well as the maternal uterus, adnexal regions, and pelvic cul-de-sac. Transvaginal technique was performed to assess early pregnancy. COMPARISON:  None. FINDINGS: Intrauterine gestational sac: Single Yolk sac:  Visualized. Embryo:  Visualized. Cardiac Activity: Visualized. Heart Rate: 124  bpm MSD:   mm    w     d CRL:  8  mm  6 w   4 d                  US EDC: May 11, 2017 Subchorionic hemorrhage:  None visualized. Maternal uterus/adnexae: Normal ovaries with a corpus luteum cyst on the left. There is a small amount of fluid in the cul-de-sac. IMPRESSION: 1. Single live IUP. 2. Physiologic fluid in the pelvis. Electronically Signed   By: Gerome Samavid  Williams III M.D   On: 09/19/2016 18:43   MAU Course  Procedures  MDM Labs ordered and reviewed. Normal IUP on US. Cramping likely physiologic to early pregnancy. Stable for discharge home.  Assessment and Plan   1. [redacted] weeks gestation of pregnancy   2. Abdominal cramping affecting pregnancy   3. Normal intrauterine pregnancy on prenatal ultrasound in first trimester    Discharge home Follow up with OBGYN provider of choice to start care Continue PNV daily SAB/return precautions Pregnancy letter provided Return to school tomorrow  Allergies as of 09/19/2016      Reactions   Benadryl [diphenhydramine Hcl] Swelling      Medication List    STOP taking these medications   ibuprofen 200 MG tablet Commonly known as:  ADVIL,MOTRIN     TAKE these medications   Biotin 3 MG Tabs Take 1 tablet by mouth daily.   prenatal multivitamin Tabs tablet Take 1 tablet by mouth daily at 12 noon.            Discharge Care Instructions        Start     Ordered   09/19/16 0000  Discharge patient    Question Answer Comment  Discharge disposition 01-Home or Self Care   Discharge patient date 09/19/2016      09/19/16 1909     Rober MinionMelanie Maribel Luis, CNM 09/19/2016, 4:17 PM

## 2016-09-19 NOTE — Discharge Instructions (Signed)

## 2016-09-19 NOTE — MAU Note (Signed)
Pt reports she has abd cramping since earlier today. Had a positive HPT. Denies any vag bleeding or discharge.

## 2016-09-20 LAB — GC/CHLAMYDIA PROBE AMP (~~LOC~~) NOT AT ARMC
CHLAMYDIA, DNA PROBE: NEGATIVE
NEISSERIA GONORRHEA: NEGATIVE

## 2016-10-21 ENCOUNTER — Encounter: Payer: Self-pay | Admitting: Certified Nurse Midwife

## 2016-10-21 ENCOUNTER — Other Ambulatory Visit (HOSPITAL_COMMUNITY)
Admission: RE | Admit: 2016-10-21 | Discharge: 2016-10-21 | Disposition: A | Discharge status: Home / Self Care | Payer: Medicaid Other | Source: Ambulatory Visit | Attending: Certified Nurse Midwife | Admitting: Certified Nurse Midwife

## 2016-10-21 ENCOUNTER — Ambulatory Visit (INDEPENDENT_AMBULATORY_CARE_PROVIDER_SITE_OTHER): Payer: Medicaid Other | Admitting: Certified Nurse Midwife

## 2016-10-21 VITALS — BP 124/58 | HR 91 | Wt 100.6 lb

## 2016-10-21 DIAGNOSIS — O0990 Supervision of high risk pregnancy, unspecified, unspecified trimester: Secondary | ICD-10-CM

## 2016-10-21 DIAGNOSIS — Z3480 Encounter for supervision of other normal pregnancy, unspecified trimester: Secondary | ICD-10-CM | POA: Insufficient documentation

## 2016-10-21 DIAGNOSIS — Z3481 Encounter for supervision of other normal pregnancy, first trimester: Secondary | ICD-10-CM | POA: Diagnosis not present

## 2016-10-21 HISTORY — DX: Supervision of high risk pregnancy, unspecified, unspecified trimester: O09.90

## 2016-10-21 MED ORDER — PRENATE PIXIE 10-0.6-0.4-200 MG PO CAPS: 1.0000 | ORAL_CAPSULE | Freq: Every day | ORAL | 12 refills | Status: DC

## 2016-10-21 NOTE — Patient Instructions (Signed)
How a Baby Grows During Pregnancy Pregnancy begins when a female's sperm enters a female's egg (fertilization). This happens in one of the tubes (fallopian tubes) that connect the ovaries to the womb (uterus). The fertilized egg is called an embryo until it reaches 10 weeks. From 10 weeks until birth, it is called a fetus. The fertilized egg moves down the fallopian tube to the uterus. Then it implants into the lining of the uterus and begins to grow. The developing fetus receives oxygen and nutrients through the pregnant woman's bloodstream and the tissues that grow (placenta) to support the fetus. The placenta is the life support system for the fetus. It provides nutrition and removes waste. Learning as much as you can about your pregnancy and how your baby is developing can help you enjoy the experience. It can also make you aware of when there might be a problem and when to ask questions. How long does a typical pregnancy last? A pregnancy usually lasts 280 days, or about 40 weeks. Pregnancy is divided into three trimesters:  First trimester: 0-13 weeks.  Second trimester: 14-27 weeks.  Third trimester: 28-40 weeks.  The day when your baby is considered ready to be born (full term) is your estimated date of delivery. How does my baby develop month by month? First month  The fertilized egg attaches to the inside of the uterus.  Some cells will form the placenta. Others will form the fetus.  The arms, legs, brain, spinal cord, lungs, and heart begin to develop.  At the end of the first month, the heart begins to beat.  Second month  The bones, inner ear, eyelids, hands, and feet form.  The genitals develop.  By the end of 8 weeks, all major organs are developing.  Third month  All of the internal organs are forming.  Teeth develop below the gums.  Bones and muscles begin to grow. The spine can flex.  The skin is transparent.  Fingernails and toenails begin to form.  Arms  and legs continue to grow longer, and hands and feet develop.  The fetus is about 3 in (7.6 cm) long.  Fourth month  The placenta is completely formed.  The external sex organs, neck, outer ear, eyebrows, eyelids, and fingernails are formed.  The fetus can hear, swallow, and move its arms and legs.  The kidneys begin to produce urine.  The skin is covered with a white waxy coating (vernix) and very fine hair (lanugo).  Fifth month  The fetus moves around more and can be felt for the first time (quickening).  The fetus starts to sleep and wake up and may begin to suck its finger.  The nails grow to the end of the fingers.  The organ in the digestive system that makes bile (gallbladder) functions and helps to digest the nutrients.  If your baby is a girl, eggs are present in her ovaries. If your baby is a boy, testicles start to move down into his scrotum.  Sixth month  The lungs are formed, but the fetus is not yet able to breathe.  The eyes open. The brain continues to develop.  Your baby has fingerprints and toe prints. Your baby's hair grows thicker.  At the end of the second trimester, the fetus is about 9 in (22.9 cm) long.  Seventh month  The fetus kicks and stretches.  The eyes are developed enough to sense changes in light.  The hands can make a grasping motion.  The   fetus responds to sound.  Eighth month  All organs and body systems are fully developed and functioning.  Bones harden and taste buds develop. The fetus may hiccup.  Certain areas of the brain are still developing. The skull remains soft.  Ninth month  The fetus gains about  lb (0.23 kg) each week.  The lungs are fully developed.  Patterns of sleep develop.  The fetus's head typically moves into a head-down position (vertex) in the uterus to prepare for birth. If the buttocks move into a vertex position instead, the baby is breech.  The fetus weighs 6-9 lbs (2.72-4.08 kg) and is  19-20 in (48.26-50.8 cm) long.  What can I do to have a healthy pregnancy and help my baby develop? Eating and Drinking  Eat a healthy diet. ? Talk with your health care provider to make sure that you are getting the nutrients that you and your baby need. ? Visit www.choosemyplate.gov to learn about creating a healthy diet.  Gain a healthy amount of weight during pregnancy as advised by your health care provider. This is usually 25-35 pounds. You may need to: ? Gain more if you were underweight before getting pregnant or if you are pregnant with more than one baby. ? Gain less if you were overweight or obese when you got pregnant.  Medicines and Vitamins  Take prenatal vitamins as directed by your health care provider. These include vitamins such as folic acid, iron, calcium, and vitamin D. They are important for healthy development.  Take medicines only as directed by your health care provider. Read labels and ask a pharmacist or your health care provider whether over-the-counter medicines, supplements, and prescription drugs are safe to take during pregnancy.  Activities  Be physically active as advised by your health care provider. Ask your health care provider to recommend activities that are safe for you to do, such as walking or swimming.  Do not participate in strenuous or extreme sports.  Lifestyle  Do not drink alcohol.  Do not use any tobacco products, including cigarettes, chewing tobacco, or electronic cigarettes. If you need help quitting, ask your health care provider.  Do not use illegal drugs.  Safety  Avoid exposure to mercury, lead, or other heavy metals. Ask your health care provider about common sources of these heavy metals.  Avoid listeria infection during pregnancy. Follow these precautions: ? Do not eat soft cheeses or deli meats. ? Do not eat hot dogs unless they have been warmed up to the point of steaming, such as in the microwave oven. ? Do not  drink unpasteurized milk.  Avoid toxoplasmosis infection during pregnancy. Follow these precautions: ? Do not change your cat's litter box, if you have a cat. Ask someone else to do this for you. ? Wear gardening gloves while working in the yard.  General Instructions  Keep all follow-up visits as directed by your health care provider. This is important. This includes prenatal care and screening tests.  Manage any chronic health conditions. Work closely with your health care provider to keep conditions, such as diabetes, under control.  How do I know if my baby is developing well? At each prenatal visit, your health care provider will do several different tests to check on your health and keep track of your baby's development. These include:  Fundal height. ? Your health care provider will measure your growing belly from top to bottom using a tape measure. ? Your health care provider will also feel your belly   to determine your baby's position.  Heartbeat. ? An ultrasound in the first trimester can confirm pregnancy and show a heartbeat, depending on how far along you are. ? Your health care provider will check your baby's heart rate at every prenatal visit. ? As you get closer to your delivery date, you may have regular fetal heart rate monitoring to make sure that your baby is not in distress.  Second trimester ultrasound. ? This ultrasound checks your baby's development. It also indicates your baby's gender.  What should I do if I have concerns about my baby's development? Always talk with your health care provider about any concerns that you may have. This information is not intended to replace advice given to you by your health care provider. Make sure you discuss any questions you have with your health care provider. Document Released: 06/19/2007 Document Revised: 06/08/2015 Document Reviewed: 06/09/2013 Elsevier Interactive Patient Education  2018 [employer].  First  Trimester of Pregnancy The first trimester of pregnancy is from week 1 until the end of week 13 (months 1 through 3). During this time, your baby will begin to develop inside you. At 6-8 weeks, the eyes and face are formed, and the heartbeat can be seen on ultrasound. At the end of 12 weeks, all the baby's organs are formed. Prenatal care is all the medical care you receive before the birth of your baby. Make sure you get good prenatal care and follow all of your doctor's instructions. Follow these instructions at home: Medicines  Take over-the-counter and prescription medicines only as told by your doctor. Some medicines are safe and some medicines are not safe during pregnancy.  Take a prenatal vitamin that contains at least 600 micrograms (mcg) of folic acid.  If you have trouble pooping (constipation), take medicine that will make your stool soft (stool softener) if your doctor approves. Eating and drinking  Eat regular, healthy meals.  Your doctor will tell you the amount of weight gain that is right for you.  Avoid raw meat and uncooked cheese.  If you feel sick to your stomach (nauseous) or throw up (vomit): ? Eat 4 or 5 small meals a day instead of 3 large meals. ? Try eating a few soda crackers. ? Drink liquids between meals instead of during meals.  To prevent constipation: ? Eat foods that are high in fiber, like fresh fruits and vegetables, whole grains, and beans. ? Drink enough fluids to keep your pee (urine) clear or pale yellow. Activity  Exercise only as told by your doctor. Stop exercising if you have cramps or pain in your lower belly (abdomen) or low back.  Do not exercise if it is too hot, too humid, or if you are in a place of great height (high altitude).  Try to avoid standing for long periods of time. Move your legs often if you must stand in one place for a long time.  Avoid heavy lifting.  Wear low-heeled shoes. Sit and stand up straight.  You can have  sex unless your doctor tells you not to. Relieving pain and discomfort  Wear a good support bra if your breasts are sore.  Take warm water baths (sitz baths) to soothe pain or discomfort caused by hemorrhoids. Use hemorrhoid cream if your doctor says it is okay.  Rest with your legs raised if you have leg cramps or low back pain.  If you have puffy, bulging veins (varicose veins) in your legs: ? Wear support hose or compression  stockings as told by your doctor. ? Raise (elevate) your feet for 15 minutes, 3-4 times a day. ? Limit salt in your food. Prenatal care  Schedule your prenatal visits by the twelfth week of pregnancy.  Write down your questions. Take them to your prenatal visits.  Keep all your prenatal visits as told by your doctor. This is important. Safety  Wear your seat belt at all times when driving.  Make a list of emergency phone numbers. The list should include numbers for family, friends, the hospital, and police and fire departments. General instructions  Ask your doctor for a referral to a local prenatal class. Begin classes no later than at the start of month 6 of your pregnancy.  Ask for help if you need counseling or if you need help with nutrition. Your doctor can give you advice or tell you where to go for help.  Do not use hot tubs, steam rooms, or saunas.  Do not douche or use tampons or scented sanitary pads.  Do not cross your legs for long periods of time.  Avoid all herbs and alcohol. Avoid drugs that are not approved by your doctor.  Do not use any tobacco products, including cigarettes, chewing tobacco, and electronic cigarettes. If you need help quitting, ask your doctor. You may get counseling or other support to help you quit.  Avoid cat litter boxes and soil used by cats. These carry germs that can cause birth defects in the baby and can cause a loss of your baby (miscarriage) or stillbirth.  Visit your dentist. At home, brush your teeth  with a soft toothbrush. Be gentle when you floss. Contact a doctor if:  You are dizzy.  You have mild cramps or pressure in your lower belly.  You have a nagging pain in your belly area.  You continue to feel sick to your stomach, you throw up, or you have watery poop (diarrhea).  You have a bad smelling fluid coming from your vagina.  You have pain when you pee (urinate).  You have increased puffiness (swelling) in your face, hands, legs, or ankles. Get help right away if:  You have a fever.  You are leaking fluid from your vagina.  You have spotting or bleeding from your vagina.  You have very bad belly cramping or pain.  You gain or lose weight rapidly.  You throw up blood. It may look like coffee grounds.  You are around people who have [place] measles, fifth disease, or chickenpox.  You have a very bad headache.  You have shortness of breath.  You have any kind of trauma, such as from a fall or a car accident. Summary  The first trimester of pregnancy is from week 1 until the end of week 13 (months 1 through 3).  To take care of yourself and your unborn baby, you will need to eat healthy meals, take medicines only if your doctor tells you to do so, and do activities that are safe for you and your baby.  Keep all follow-up visits as told by your doctor. This is important as your doctor will have to ensure that your baby is healthy and growing well. This information is not intended to replace advice given to you by your health care provider. Make sure you discuss any questions you have with your health care provider. Document Released: 06/19/2007 Document Revised: 01/09/2016 Document Reviewed: 01/09/2016 Elsevier Interactive Patient Education  2017 [employer].

## 2016-10-21 NOTE — Progress Notes (Signed)
Pt admits she is concerned about her weight.  She states she is "afraid" b/c she is so small and "can not" gain weight.

## 2016-10-21 NOTE — Addendum Note (Signed)
Addended by: Lear Ng on: 10/21/2016 11:05 AM   Modules accepted: Orders

## 2016-10-21 NOTE — Progress Notes (Signed)
Subjective:    Nancy Michael is being seen today for her first obstetrical visit.  This is a planned pregnancy. She is at [redacted]w[redacted]d gestation. Her obstetrical history is significant for low maternal weight, hx of 1 miscarriage and 1 ectopic (meds). Relationship with FOB: significant other, living together. Patient does intend to breast feed. Pregnancy history fully reviewed.  The information documented in the HPI was reviewed and verified.  Menstrual History: OB History    Gravida Para Term Preterm AB Living   3       2 0   SAB TAB Ectopic Multiple Live Births   1   1   0       Patient's last menstrual period was 07/28/2016 (approximate).    Past Medical History:  Diagnosis Date  . Bronchitis   . Seasonal allergies     Past Surgical History:  Procedure Laterality Date  . NO PAST SURGERIES       (Not in a hospital admission) Allergies  Allergen Reactions  . Benadryl [Diphenhydramine Hcl] Swelling  . Ibuprofen Swelling    Social History  Substance Use Topics  . Smoking status: Current Some Day Smoker    Packs/day: 0.25    Types: Cigarettes    Last attempt to quit: 09/25/2011  . Smokeless tobacco: Never Used  . Alcohol use 0.6 oz/week    1 Cans of beer per week    Family History  Problem Relation Age of Onset  . Diabetes Mother   . Hypertension Mother   . Cancer Maternal Grandmother        Lung  . Diabetes Maternal Grandmother   . Cancer Maternal Grandfather        Lung  . Diabetes Maternal Grandfather   . Other Neg Hx      Review of Systems Constitutional: negative for weight loss Gastrointestinal: negative for vomiting Genitourinary:negative for genital lesions and vaginal discharge and dysuria Musculoskeletal:negative for back pain Behavioral/Psych: negative for abusive relationship, depression, illegal drug usage and tobacco use    Objective:    BP (!) 124/58   Pulse 91   Wt 100 lb 9.6 oz (45.6 kg)   LMP 07/28/2016 (Approximate)   BMI 16.49 kg/m   General Appearance:    Alert, cooperative, no distress, appears stated age  Head:    Normocephalic, without obvious abnormality, atraumatic  Eyes:    PERRL, conjunctiva/corneas clear, EOM's intact, fundi    benign, both eyes  Ears:    Normal TM's and external ear canals, both ears  Nose:   Nares normal, septum midline, mucosa normal, no drainage    or sinus tenderness  Throat:   Lips, mucosa, and tongue normal; teeth and gums normal  Neck:   Supple, symmetrical, trachea midline, no adenopathy;    thyroid:  no enlargement/tenderness/nodules; no carotid   bruit or JVD  Back:     Symmetric, no curvature, ROM normal, no CVA tenderness  Lungs:     Clear to auscultation bilaterally, respirations unlabored  Chest Wall:    No tenderness or deformity   Heart:    Regular rate and rhythm, S1 and S2 normal, no murmur, rub   or gallop  Breast Exam:    No tenderness, masses, or nipple abnormality  Abdomen:     Soft, non-tender, bowel sounds active all four quadrants,    no masses, no organomegaly  Genitalia:    Normal female without lesion, discharge or tenderness  Extremities:   Extremities normal, atraumatic, no cyanosis  or edema  Pulses:   2+ and symmetric all extremities  Skin:   Skin color, texture, turgor normal, no rashes or lesions  Lymph nodes:   Cervical, supraclavicular, and axillary nodes normal  Neurologic:   CNII-XII intact, normal strength, sensation and reflexes    throughout   Cervix: long, thick, closed and posterior.  FHR: 150 by doppler. Size c/w dates/early [place]    Lab Review Urine pregnancy test Labs reviewed yes Radiologic studies reviewed yes  Assessment & Plan    Pregnancy at [redacted]w[redacted]d weeks    1. Supervision of other normal pregnancy, antepartum     Doing well.  - Culture, OB Urine - Hemoglobin A1c - Prenat-FeAsp-Meth-FA-DHA w/o A (PRENATE PIXIE) 10-0.6-0.4-200 MG CAPS; Take 1 tablet by mouth daily.  Dispense: 30 capsule; Refill: 12     Prenatal vitamins.   Counseling provided regarding continued use of seat belts, cessation of alcohol consumption, smoking or use of illicit drugs; infection precautions i.e., influenza/TDAP immunizations, toxoplasmosis,CMV, parvovirus, listeria and varicella; workplace safety, exercise during pregnancy; routine dental care, safe medications, sexual activity, hot tubs, saunas, pools, travel, caffeine use, fish and methlymercury, potential toxins, hair treatments, varicose veins Weight gain recommendations per IOM guidelines reviewed: underweight/BMI< 18.5--> gain 28 - 40 lbs; normal weight/BMI 18.5 - 24.9--> gain 25 - 35 lbs; overweight/BMI 25 - 29.9--> gain 15 - 25 lbs; obese/BMI >30->gain  11 - 20 lbs Problem list reviewed and updated. FIRST/CF mutation testing/NIPT/QUAD SCREEN/fragile X/Ashkenazi Jewish population testing/Spinal muscular atrophy discussed: ordered. Role of ultrasound in pregnancy discussed; fetal survey: ordered. Amniocentesis discussed: not indicated.  Meds ordered this encounter  Medications  . Prenat-FeAsp-Meth-FA-DHA w/o A (PRENATE PIXIE) 10-0.6-0.4-200 MG CAPS    Sig: Take 1 tablet by mouth daily.    Dispense:  30 capsule    Refill:  12    Please process coupon: Rx BIN: [id number], RxPCN: [id number], RxGRP: [id number], RxID: [id number]  SUF: 01   Orders Placed This Encounter  Procedures  . Culture, OB Urine  . [place] MFM OB COMP + 14 WK    Standing Status:   Future    Standing Expiration Date:   12/21/2017    Order Specific Question:   Reason for Exam (SYMPTOM  OR DIAGNOSIS REQUIRED)    Answer:   fetal anatomy scan    Order Specific Question:   Preferred imaging location?    Answer:   [facility]Ul[facility]ound  . Hemoglobinopathy evaluation  . Varicella zoster antibody, IgG  . MaterniT21 PLUS Core+SCA    Order Specific Question:   Is the patient insulin dependent?    Answer:   No    Order Specific Question:   Please enter gestational age. This should be expressed as weeks AND days, i.e. 16w 6d. Enter weeks  here. Enter days in next question.    Answer:   36    Order Specific Question:   Please enter gestational age. This should be expressed as weeks AND days, i.e. 16w 6d. Enter days here. Enter weeks in previous question.    Answer:   1    Order Specific Question:   How was gestational age calculated?    Answer:   Ultrasound    Order Specific Question:   Please give the date of LMP OR Ultrasound OR Estimated date of delivery.    Answer:   05/11/2017    Order Specific Question:   Number of Fetuses (Type of Pregnancy):    Answer:   1    Order Specific Question:  Indications for performing the test? (please choose all that apply):    Answer:   Routine screening    Order Specific Question:   Other Indications? (Y=Yes, N=No)    Answer:   Y    Order Specific Question:   Please specify other indications, if any:    Answer:   hx miscarriage    Order Specific Question:   If this is a repeat specimen, please indicate the reason:    Answer:   Not indicated    Order Specific Question:   Please specify the patient's race: (C=White/Caucasion, B=Black, I=Native American, A=Asian, H=Hispanic, O=Other, U=Unknown)    Answer:   B    Order Specific Question:   Donor Egg - indicate if the egg was obtained from in vitro fertilization.    Answer:   N    Order Specific Question:   Age of Egg Donor.    Answer:   55    Order Specific Question:   Prior Down Syndrome/ONTD screening during current pregnancy.    Answer:   N    Order Specific Question:   Prior First Trimester Testing    Answer:   N    Order Specific Question:   Prior Second Trimester Testing    Answer:   N    Order Specific Question:   Family History of Neural Tube Defects    Answer:   N    Order Specific Question:   Prior Pregnancy with Down Syndrome    Answer:   N    Order Specific Question:   Please give the patient's weight (in pounds)    Answer:   100  . Obstetric Panel, Including HIV  . Inheritest Society Guided  . Hemoglobin A1c     Follow up in 4 weeks. 50% of 30 min visit spent on counseling and coordination of care.

## 2016-10-22 ENCOUNTER — Other Ambulatory Visit: Payer: Self-pay | Admitting: Certified Nurse Midwife

## 2016-10-22 DIAGNOSIS — Z3480 Encounter for supervision of other normal pregnancy, unspecified trimester: Secondary | ICD-10-CM

## 2016-10-22 LAB — HEMOGLOBIN A1C
Est. average glucose Bld gHb Est-mCnc: 82 mg/dL
HEMOGLOBIN A1C: 4.5 % — AB (ref 4.8–5.6)

## 2016-10-22 LAB — CYTOLOGY - PAP: DIAGNOSIS: NEGATIVE

## 2016-10-22 LAB — CERVICOVAGINAL ANCILLARY ONLY
BACTERIAL VAGINITIS: POSITIVE — AB
Candida vaginitis: NEGATIVE
Chlamydia: NEGATIVE
Neisseria Gonorrhea: NEGATIVE
TRICH (WINDOWPATH): NEGATIVE

## 2016-10-23 ENCOUNTER — Other Ambulatory Visit: Payer: Self-pay | Admitting: Certified Nurse Midwife

## 2016-10-23 DIAGNOSIS — N760 Acute vaginitis: Principal | ICD-10-CM

## 2016-10-23 DIAGNOSIS — B9689 Other specified bacterial agents as the cause of diseases classified elsewhere: Secondary | ICD-10-CM

## 2016-10-23 LAB — URINE CULTURE, OB REFLEX

## 2016-10-23 LAB — CULTURE, OB URINE

## 2016-10-23 MED ORDER — METRONIDAZOLE 0.75 % VA GEL: 1.0000 | Freq: Two times a day (BID) | VAGINAL | 0 refills | Status: DC

## 2016-10-27 LAB — MATERNIT21 PLUS CORE+SCA
CHROMOSOME 13: NEGATIVE
CHROMOSOME 18: NEGATIVE
CHROMOSOME 21: NEGATIVE
Y CHROMOSOME: NOT DETECTED

## 2016-10-28 ENCOUNTER — Other Ambulatory Visit: Payer: Self-pay | Admitting: Certified Nurse Midwife

## 2016-10-28 DIAGNOSIS — Z3480 Encounter for supervision of other normal pregnancy, unspecified trimester: Secondary | ICD-10-CM

## 2016-10-29 ENCOUNTER — Encounter: Payer: Self-pay | Admitting: Certified Nurse Midwife

## 2016-10-29 ENCOUNTER — Other Ambulatory Visit: Payer: Self-pay | Admitting: Certified Nurse Midwife

## 2016-11-01 ENCOUNTER — Other Ambulatory Visit: Payer: Self-pay | Admitting: Certified Nurse Midwife

## 2016-11-01 DIAGNOSIS — Z3480 Encounter for supervision of other normal pregnancy, unspecified trimester: Secondary | ICD-10-CM

## 2016-11-01 LAB — OBSTETRIC PANEL, INCLUDING HIV
ANTIBODY SCREEN: NEGATIVE
BASOS ABS: 0 10*3/uL (ref 0.0–0.2)
BASOS: 0 %
EOS (ABSOLUTE): 0.1 10*3/uL (ref 0.0–0.4)
Eos: 1 %
HEMATOCRIT: 39.3 % (ref 34.0–46.6)
HEP B S AG: NEGATIVE
HIV SCREEN 4TH GENERATION: NONREACTIVE
Hemoglobin: 12.4 g/dL (ref 11.1–15.9)
IMMATURE GRANS (ABS): 0 10*3/uL (ref 0.0–0.1)
Immature Granulocytes: 0 %
LYMPHS: 18 %
Lymphocytes Absolute: 2.6 10*3/uL (ref 0.7–3.1)
MCH: 30.8 pg (ref 26.6–33.0)
MCHC: 31.6 g/dL (ref 31.5–35.7)
MCV: 98 fL — AB (ref 79–97)
MONOCYTES: 4 %
Monocytes Absolute: 0.6 10*3/uL (ref 0.1–0.9)
NEUTROS ABS: 10.9 10*3/uL — AB (ref 1.4–7.0)
Neutrophils: 77 %
Platelets: 450 10*3/uL — ABNORMAL HIGH (ref 150–379)
RBC: 4.03 x10E6/uL (ref 3.77–5.28)
RDW: 12.2 % — AB (ref 12.3–15.4)
RPR: NONREACTIVE
RUBELLA: 4.36 {index} (ref >0.99)
Rh Factor: POSITIVE
WBC: 14.3 10*3/uL — ABNORMAL HIGH (ref 3.4–10.8)

## 2016-11-01 LAB — HEMOGLOBINOPATHY EVALUATION
HEMOGLOBIN F QUANTITATION: 0.6 % (ref 0.0–2.0)
HGB C: 0 %
HGB S: 0 %
HGB VARIANT: 0 %
Hemoglobin A2 Quantitation: 2.8 % (ref 1.8–3.2)
Hgb A: 96.6 % (ref 96.4–98.8)

## 2016-11-01 LAB — INHERITEST SOCIETY GUIDED

## 2016-11-01 LAB — VARICELLA ZOSTER ANTIBODY, IGG: VARICELLA: 1405 {index} (ref >165)

## 2016-11-11 ENCOUNTER — Telehealth: Payer: Self-pay | Admitting: Pediatrics

## 2016-11-11 NOTE — Telephone Encounter (Signed)
[facility] from [facility] states she needs dx code for 10/21/16 labs.  REF# [id number]

## 2016-11-12 ENCOUNTER — Encounter: Payer: Self-pay | Admitting: Certified Nurse Midwife

## 2016-11-12 NOTE — Telephone Encounter (Signed)
I spoke with billing at [facility], updated dx codes.

## 2016-11-18 ENCOUNTER — Ambulatory Visit (INDEPENDENT_AMBULATORY_CARE_PROVIDER_SITE_OTHER): Payer: Medicaid Other | Admitting: Certified Nurse Midwife

## 2016-11-18 VITALS — BP 103/72 | HR 83 | Wt 109.0 lb

## 2016-11-18 DIAGNOSIS — Z3482 Encounter for supervision of other normal pregnancy, second trimester: Secondary | ICD-10-CM

## 2016-11-18 DIAGNOSIS — Z720 Tobacco use: Secondary | ICD-10-CM | POA: Insufficient documentation

## 2016-11-18 DIAGNOSIS — Z3480 Encounter for supervision of other normal pregnancy, unspecified trimester: Secondary | ICD-10-CM

## 2016-11-18 NOTE — Progress Notes (Signed)
Pt states she is doing well at this time.

## 2016-11-18 NOTE — Progress Notes (Signed)
   PRENATAL VISIT NOTE  Subjective:  Nancy Michael is a 26 y.o. G3P0020 at 9055w1d being seen today for ongoing prenatal care.  She is currently monitored for the following issues for this low-risk pregnancy and has Supervision of other normal pregnancy, antepartum and Tobacco abuse on their problem list.  Patient reports no complaints.  Contractions: Not present. Vag. Bleeding: None.  Movement: Absent. Denies leaking of fluid.   The following portions of the patient's history were reviewed and updated as appropriate: allergies, current medications, past family history, past medical history, past social history, past surgical history and problem list. Problem list updated.  Objective:   Vitals:   11/18/16 0830  BP: 103/72  Pulse: 83  Weight: 109 lb (49.4 kg)    Fetal Status: Fetal Heart Rate (bpm): 143; doppler   Movement: Absent     General:  Alert, oriented and cooperative. Patient is in no acute distress.  Skin: Skin is warm and dry. No rash noted.   Cardiovascular: Normal heart rate noted  Respiratory: Normal respiratory effort, no problems with respiration noted  Abdomen: Soft, gravid, appropriate for gestational age.  Pain/Pressure: Absent     Pelvic: Cervical exam deferred        Extremities: Normal range of motion.  Edema: None  Mental Status:  Normal mood and affect. Normal behavior. Normal judgment and thought content.   Assessment and Plan:  Pregnancy: G3P0020 at 5055w1d  1. Supervision of other normal pregnancy, antepartum     Doing well. Trying to quit smoking.  Reports smoking 2 weeks ago.   - AFP, Serum, Open Spina Bifida  Preterm labor symptoms and general obstetric precautions including but not limited to vaginal bleeding, contractions, leaking of fluid and fetal movement were reviewed in detail with the patient. Please refer to After Visit Summary for other counseling recommendations.  Return in about 4 weeks (around 12/16/2016) for ROB.   Roe Coombsachelle A Caelie [place],  CNM

## 2016-11-21 ENCOUNTER — Other Ambulatory Visit: Payer: Self-pay | Admitting: Certified Nurse Midwife

## 2016-11-21 DIAGNOSIS — Z3480 Encounter for supervision of other normal pregnancy, unspecified trimester: Secondary | ICD-10-CM

## 2016-11-21 LAB — AFP, SERUM, OPEN SPINA BIFIDA
AFP MOM: 1.62
AFP VALUE AFPOSL: 62.4 ng/mL
Gest. Age on Collection Date: 15.1 weeks
MATERNAL AGE AT EDD: 29 a
OSBR RISK 1 IN: 3989
Test Results:: NEGATIVE
Weight: 109 [lb_av]

## 2016-11-26 ENCOUNTER — Inpatient Hospital Stay (HOSPITAL_COMMUNITY)
Admission: AD | Admit: 2016-11-26 | Discharge: 2016-11-26 | Disposition: A | Discharge status: Home / Self Care | Payer: Medicaid Other | Source: Ambulatory Visit | Attending: Obstetrics and Gynecology | Admitting: Obstetrics and Gynecology

## 2016-11-26 ENCOUNTER — Encounter: Payer: Self-pay | Admitting: Certified Nurse Midwife

## 2016-11-26 ENCOUNTER — Encounter (HOSPITAL_COMMUNITY): Payer: Self-pay | Admitting: *Deleted

## 2016-11-26 ENCOUNTER — Other Ambulatory Visit: Payer: Self-pay

## 2016-11-26 DIAGNOSIS — O99512 Diseases of the respiratory system complicating pregnancy, second trimester: Secondary | ICD-10-CM | POA: Diagnosis not present

## 2016-11-26 DIAGNOSIS — Z3A16 16 weeks gestation of pregnancy: Secondary | ICD-10-CM | POA: Insufficient documentation

## 2016-11-26 DIAGNOSIS — J069 Acute upper respiratory infection, unspecified: Secondary | ICD-10-CM | POA: Diagnosis not present

## 2016-11-26 DIAGNOSIS — R52 Pain, unspecified: Secondary | ICD-10-CM | POA: Diagnosis present

## 2016-11-26 DIAGNOSIS — Z87891 Personal history of nicotine dependence: Secondary | ICD-10-CM | POA: Diagnosis not present

## 2016-11-26 DIAGNOSIS — O9989 Other specified diseases and conditions complicating pregnancy, childbirth and the puerperium: Secondary | ICD-10-CM | POA: Diagnosis not present

## 2016-11-26 HISTORY — DX: Unspecified asthma, uncomplicated: J45.909

## 2016-11-26 LAB — URINALYSIS, ROUTINE W REFLEX MICROSCOPIC
Bilirubin Urine: NEGATIVE
GLUCOSE, UA: NEGATIVE mg/dL
HGB URINE DIPSTICK: NEGATIVE
Ketones, ur: 5 mg/dL — AB
Leukocytes, UA: NEGATIVE
Nitrite: NEGATIVE
PH: 5 (ref 5.0–8.0)
Protein, ur: NEGATIVE mg/dL
SPECIFIC GRAVITY, URINE: 1.019 (ref 1.005–1.030)

## 2016-11-26 LAB — INFLUENZA PANEL BY PCR (TYPE A & B)
INFLBPCR: NEGATIVE
Influenza A By PCR: NEGATIVE

## 2016-11-26 LAB — RAPID STREP SCREEN (MED CTR MEBANE ONLY): STREPTOCOCCUS, GROUP A SCREEN (DIRECT): NEGATIVE

## 2016-11-26 NOTE — Discharge Instructions (Signed)
Upper Respiratory Infection, Adult Most upper respiratory infections (URIs) are caused by a virus. A URI affects the nose, throat, and upper air passages. The most common type of URI is often called "the common cold." Follow these instructions at home:  Take medicines only as told by your doctor.  Gargle warm saltwater or take cough drops to comfort your throat as told by your doctor.  Use a warm mist humidifier or inhale steam from a shower to increase air moisture. This may make it easier to breathe.  Drink enough fluid to keep your pee (urine) clear or pale yellow.  Eat soups and other clear broths.  Have a healthy diet.  Rest as needed.  Go back to work when your fever is gone or your doctor says it is okay. ? You may need to stay home longer to avoid giving your URI to others. ? You can also wear a face mask and wash your hands often to prevent spread of the virus.  Use your inhaler more if you have asthma.  Do not use any tobacco products, including cigarettes, chewing tobacco, or electronic cigarettes. If you need help quitting, ask your doctor. Contact a doctor if:  You are getting worse, not better.  Your symptoms are not helped by medicine.  You have chills.  You are getting more short of breath.  You have brown or red mucus.  You have yellow or brown discharge from your nose.  You have pain in your face, especially when you bend forward.  You have a fever.  You have puffy (swollen) neck glands.  You have pain while swallowing.  You have white areas in the back of your throat. Get help right away if:  You have very bad or constant: ? Headache. ? Ear pain. ? Pain in your forehead, behind your eyes, and over your cheekbones (sinus pain). ? Chest pain.  You have long-lasting (chronic) lung disease and any of the following: ? Wheezing. ? Long-lasting cough. ? Coughing up blood. ? A change in your usual mucus.  You have a stiff neck.  You have  changes in your: ? Vision. ? Hearing. ? Thinking. ? Mood. This information is not intended to replace advice given to you by your health care provider. Make sure you discuss any questions you have with your health care provider. Document Released: 06/19/2007 Document Revised: 09/03/2015 Document Reviewed: 04/07/2013 Elsevier Interactive Patient Education  2018 Elsevier Inc.  

## 2016-11-26 NOTE — MAU Note (Signed)
Is really sick, yesterday was a little worse.  Body aches, sore throat- can barely swallow. Diarrhea started this morning. Has not checked fever. Coughed up a "mucous plug" this morning, freaked her out, that is why she came in.

## 2016-11-26 NOTE — MAU Provider Note (Signed)
History     CSN: [id number]  Arrival date and time: 11/26/16 1128   First Provider Initiated Contact with Patient 11/26/16 1234      Chief Complaint  Patient presents with  . Generalized Body Aches  . Diarrhea  . Sore Throat  . sinus drainage   G3P0020 @16 .2 wks here with sore throat, nasal congestion, and body aches. Sx started 2 days ago and worsened yesterday. She did not check her temp but felt feverish yesterday. She is using Robitussin with little improvement. She coughed up large amt of phlegm this am. No known sick contacts but works in [employer]. No pregnancy complaints.   OB History    Gravida Para Term Preterm AB Living   3       2 0   SAB TAB Ectopic Multiple Live Births   1   1   0      Past Medical History:  Diagnosis Date  . Asthma    as a child  . Bronchitis   . Seasonal allergies     Past Surgical History:  Procedure Laterality Date  . NO PAST SURGERIES      Family History  Problem Relation Age of Onset  . Diabetes Mother   . Hypertension Mother   . Cancer Maternal Grandmother        Lung  . Diabetes Maternal Grandmother   . Cancer Maternal Grandfather        Lung  . Diabetes Maternal Grandfather   . Other Neg Hx     Social History   Tobacco Use  . Smoking status: Former Smoker    Packs/day: 0.25    Types: Cigarettes    Last attempt to quit: 09/25/2011    Years since quitting: 5.1  . Smokeless tobacco: Never Used  Substance Use Topics  . Alcohol use: Yes    Alcohol/week: 0.6 oz    Types: 1 Cans of beer per week    Comment: not currently  . Drug use: No    Comment: not since preg    Allergies:  Allergies  Allergen Reactions  . Benadryl [Diphenhydramine Hcl] Swelling  . Ibuprofen Swelling    No medications prior to admission.    Review of Systems  HENT: Positive for congestion and trouble swallowing. Negative for ear pain.   Respiratory: Positive for cough. Negative for shortness of breath.    Physical Exam    Blood pressure (!) 98/57, pulse 80, temperature 98.1 F (36.7 C), temperature source Oral, resp. rate 16, weight 108 lb (49 kg), last menstrual period 07/28/2016, SpO2 100 %, unknown if currently breastfeeding.  Physical Exam  Constitutional: She is oriented to person, place, and time. She appears well-developed and well-nourished. No distress.  HENT:  Head: Normocephalic and atraumatic.  Right Ear: Hearing, tympanic membrane, external ear and ear canal normal.  Left Ear: Hearing, tympanic membrane, external ear and ear canal normal.  Nose: Nose normal. No mucosal edema.  Mouth/Throat: Uvula is midline and mucous membranes are normal. Posterior oropharyngeal erythema present. No oropharyngeal exudate, posterior oropharyngeal edema or tonsillar abscesses.  Neck: Normal range of motion.  Cardiovascular: Normal rate, regular rhythm and normal heart sounds.  Mild tachy  Respiratory: Effort normal. No respiratory distress. She has no wheezes. She has no rales.  Musculoskeletal: Normal range of motion.  Neurological: She is alert and oriented to person, place, and time.  Skin: Skin is warm and dry.  Psychiatric: She has a normal mood and affect.  FHT 170  Results for orders placed or performed during the hospital encounter of 11/26/16 (from the past 24 hour(s))  Urinalysis, Routine w reflex microscopic     Status: Abnormal   Collection Time: 11/26/16 12:01 PM  Result Value Ref Range   Color, Urine YELLOW YELLOW   APPearance CLEAR CLEAR   Specific Gravity, Urine 1.019 1.005 - 1.030   pH 5.0 5.0 - 8.0   Glucose, UA NEGATIVE NEGATIVE mg/dL   Hgb urine dipstick NEGATIVE NEGATIVE   Bilirubin Urine NEGATIVE NEGATIVE   Ketones, ur 5 (A) NEGATIVE mg/dL   Protein, ur NEGATIVE NEGATIVE mg/dL   Nitrite NEGATIVE NEGATIVE   Leukocytes, UA NEGATIVE NEGATIVE  Influenza panel by PCR (type A & B)     Status: None   Collection Time: 11/26/16 12:35 PM  Result Value Ref Range   Influenza A By PCR  NEGATIVE NEGATIVE   Influenza B By PCR NEGATIVE NEGATIVE  Rapid strep screen (not at [facility])     Status: None   Collection Time: 11/26/16 12:35 PM  Result Value Ref Range   Streptococcus, Group A Screen (Direct) NEGATIVE NEGATIVE   MAU Course  Procedures  MDM Labs ordered and reviewed. No evidence of pneumonia, influenza, or strep A. Discussed supportive measures for UR virus. Stable for discharge home.   Assessment and Plan   1. [redacted] weeks gestation of pregnancy   2. Upper respiratory virus    Discharge home Mucinex OTC Sudafed OTC Rest Hydrate Tylenol prn  Allergies as of 11/26/2016      Reactions   Benadryl [diphenhydramine Hcl] Swelling   Ibuprofen Swelling      Medication List    TAKE these medications   Biotin 3 MG Tabs Take 1 tablet by mouth daily.   PRENATE PIXIE 10-0.6-0.4-200 MG Caps Take 1 tablet by mouth daily.      Donette LarryMelanie Benjimin Hadden, CNM 11/26/2016, 2:52 PM

## 2016-11-28 LAB — CULTURE, GROUP A STREP (THRC)

## 2016-11-29 MED ORDER — PENICILLIN V POTASSIUM 500 MG PO TABS
500.0000 mg | ORAL_TABLET | Freq: Two times a day (BID) | ORAL | 0 refills | Status: DC
Start: 2016-11-29 — End: 2017-01-15

## 2016-12-16 ENCOUNTER — Ambulatory Visit (INDEPENDENT_AMBULATORY_CARE_PROVIDER_SITE_OTHER): Payer: Medicaid Other | Admitting: Certified Nurse Midwife

## 2016-12-16 ENCOUNTER — Encounter (HOSPITAL_COMMUNITY): Payer: Self-pay | Admitting: Certified Nurse Midwife

## 2016-12-16 VITALS — BP 125/76 | HR 73 | Wt 110.6 lb

## 2016-12-16 DIAGNOSIS — Z3480 Encounter for supervision of other normal pregnancy, unspecified trimester: Secondary | ICD-10-CM

## 2016-12-16 DIAGNOSIS — R350 Frequency of micturition: Secondary | ICD-10-CM

## 2016-12-16 LAB — POCT URINALYSIS DIPSTICK
Bilirubin, UA: NEGATIVE
Blood, UA: NEGATIVE
Glucose, UA: NEGATIVE
Ketones, UA: NEGATIVE
LEUKOCYTES UA: NEGATIVE
NITRITE UA: NEGATIVE
PH UA: 7.5 (ref 5.0–8.0)
PROTEIN UA: NEGATIVE
Spec Grav, UA: <=1.005 — AB (ref 1.010–1.025)
UROBILINOGEN UA: 0.2 U/dL

## 2016-12-16 NOTE — Patient Instructions (Signed)
AREA PEDIATRIC/FAMILY PRACTICE PHYSICIANS  [facility] [address], [address] [place], [place]  [address] Phone - [phone]   Fax - [phone]  [facility] [address] [place], [place] [address] Phone - [phone]   Fax - [phone]  [facility] [address] [place], [place]  [address] Phone - [phone]   Fax - [phone]  [facility] [address] [place], [place]  [address] Phone - [phone]   Fax - [phone]  [facility] [address] [place], [place]  [address] Phone - [phone]   Fax - [phone]  [facility] [address] [place], [place]  [address] Phone - [phone]   Fax - [phone]  [facility] [address] [place], [place]  [address] Phone - [phone]   Fax - [phone]  [facility] [address] [place], [place]  [address] Phone - [phone]   Fax - [phone]  [facility] [address] [place], [place]  [address] Phone - [phone]   Fax - [phone] [facility] [address] [place], [place]  [address] Phone - [phone]   Fax - [phone]  [facility] [address] [place], [place]  [address] Phone - [phone]   Fax - [phone]  [facility] [address] [place], [place]  [address] Phone - [phone]   Fax - [phone]  [facility] [address] [place], [place]  [address] Phone - [phone]   Fax - [phone]  [facility] [address] [place], [place]  [address] Phone - [phone]  [facility] [address] [place], [place]  [address] Phone - [phone]   Fax - [phone]  [facility] [address] [place], [place]  [address] Phone - [phone]   Fax - [phone]  [facility] [address] [place], [place]  [address] Phone - [phone]   Fax - [phone]  [facility] [address] [place], [place]  [address] Phone - [phone]   Fax - [phone]   [facility] [address] [place], [place]  [address] Phone - [phone]   Fax - [phone]  [facility] [address] [place], [place]  [address] Phone - [phone]   Fax - [phone]  [facility] RUB[facility] [address] [place], [place]  [address] Phone - [phone]   Fax - [phone]  [facility] [address] , [place]  [address] Phone - [phone]   Fax - [phone]  [facility] [address] , [place]  [address] Phone - [phone]   Fax - [phone] [facility] [address] [place], [place]  [address] Phone - [phone]   Fax - [phone]  [facility] [address] [place], [place]  [address] Phone - [phone]   Fax - [phone]   [facility] [address] [place],   [address] Phone - [phone]   Fax - [phone]  [facility] Charlene Flemming MD [address] [place]  [address] Phone [phone]  Fax [phone]   

## 2016-12-16 NOTE — Progress Notes (Signed)
Patient reports fetal movement with occasional pressure. 

## 2016-12-16 NOTE — Progress Notes (Signed)
   PRENATAL VISIT NOTE  Subjective:  Nancy Michael is a 26 y.o. G3P0020 at 2757w1d being seen today for ongoing prenatal care.  She is currently monitored for the following issues for this low-risk pregnancy and has Supervision of other normal pregnancy, antepartum and Tobacco abuse on their problem list.  Patient reports no bleeding, no contractions, no cramping, no leaking and round ligament pain discussed, reports urinary frequency U-dip checked.  Contractions: Not present. Vag. Bleeding: None.  Movement: Present. Denies leaking of fluid.   The following portions of the patient's history were reviewed and updated as appropriate: allergies, current medications, past family history, past medical history, past social history, past surgical history and problem list. Problem list updated.  Objective:   Vitals:   12/16/16 0844  BP: 125/76  Pulse: 73  Weight: 50.2 kg (110 lb 9.6 oz)    Fetal Status: Fetal Heart Rate (bpm): 142; doppler Fundal Height: 19 cm Movement: Present     General:  Alert, oriented and cooperative. Patient is in no acute distress.  Skin: Skin is warm and dry. No rash noted.   Cardiovascular: Normal heart rate noted  Respiratory: Normal respiratory effort, no problems with respiration noted  Abdomen: Soft, gravid, appropriate for gestational age.  Pain/Pressure: Present     Pelvic: Cervical exam deferred        Extremities: Normal range of motion.  Edema: Trace  Mental Status:  Normal mood and affect. Normal behavior. Normal judgment and thought content.   Assessment and Plan:  Pregnancy: G3P0020 at 4357w1d  1. Supervision of other normal pregnancy, antepartum     Normal discomforts of pregnancy discussed.    2. Urinary frequency     - POCT urinalysis dipstick  Preterm labor symptoms and general obstetric precautions including but not limited to vaginal bleeding, contractions, leaking of fluid and fetal movement were reviewed in detail with the patient. Please  refer to After Visit Summary for other counseling recommendations.  Return in about 4 weeks (around 01/13/2017) for ROB.   Roe Coombsachelle A Tristan Bramble, CNM

## 2016-12-23 ENCOUNTER — Ambulatory Visit (HOSPITAL_COMMUNITY): Payer: Medicaid Other

## 2016-12-30 ENCOUNTER — Ambulatory Visit (HOSPITAL_COMMUNITY)
Admission: RE | Admit: 2016-12-30 | Discharge: 2016-12-30 | Disposition: A | Discharge status: Home / Self Care | Payer: Medicaid Other | Source: Ambulatory Visit | Attending: Certified Nurse Midwife | Admitting: Certified Nurse Midwife

## 2016-12-30 ENCOUNTER — Other Ambulatory Visit: Payer: Self-pay | Admitting: Certified Nurse Midwife

## 2016-12-30 DIAGNOSIS — Z3689 Encounter for other specified antenatal screening: Secondary | ICD-10-CM | POA: Diagnosis not present

## 2016-12-30 DIAGNOSIS — O99332 Smoking (tobacco) complicating pregnancy, second trimester: Secondary | ICD-10-CM

## 2016-12-30 DIAGNOSIS — Z3480 Encounter for supervision of other normal pregnancy, unspecified trimester: Secondary | ICD-10-CM

## 2016-12-30 DIAGNOSIS — Z3A21 21 weeks gestation of pregnancy: Secondary | ICD-10-CM | POA: Diagnosis not present

## 2017-01-02 ENCOUNTER — Other Ambulatory Visit: Payer: Self-pay | Admitting: Certified Nurse Midwife

## 2017-01-02 DIAGNOSIS — Z3480 Encounter for supervision of other normal pregnancy, unspecified trimester: Secondary | ICD-10-CM

## 2017-01-14 NOTE — L&D Delivery Note (Addendum)
Delivery Note At 4:58 PM a viable and healthy female was delivered via Vaginal, Spontaneous (Presentation: cephalic ; LOA  ).  APGAR: 9, 9;  Placenta status: intact, spontaneous .  Cord: 3 vessel with no complications  Anesthesia:  epidural Episiotomy: None Lacerations: None Suture Repair: N/A Est. Blood Loss (mL): 150  Mom to postpartum.  Baby to Couplet care / Skin to Skin.  Myrene BuddyJacob Fletcher 04/20/2017, 5:14 PM  I was present for this delivery and agree with the above assessment

## 2017-01-15 ENCOUNTER — Ambulatory Visit (INDEPENDENT_AMBULATORY_CARE_PROVIDER_SITE_OTHER): Payer: Medicaid Other | Admitting: Nurse Practitioner

## 2017-01-15 ENCOUNTER — Other Ambulatory Visit: Payer: Self-pay

## 2017-01-15 VITALS — BP 99/66 | HR 91 | Wt 117.2 lb

## 2017-01-15 DIAGNOSIS — Z3480 Encounter for supervision of other normal pregnancy, unspecified trimester: Secondary | ICD-10-CM

## 2017-01-15 DIAGNOSIS — Z3482 Encounter for supervision of other normal pregnancy, second trimester: Secondary | ICD-10-CM

## 2017-01-15 NOTE — Patient Instructions (Addendum)
Second Trimester of Pregnancy The second trimester is from week 13 through week 28, month 4 through 6. This is often the time in pregnancy that you feel your best. Often times, morning sickness has lessened or quit. You may have more energy, and you may get hungry more often. Your unborn baby (fetus) is growing rapidly. At the end of the sixth month, he or she is about 9 inches long and weighs about 1 pounds. You will likely feel the baby move (quickening) between 18 and 20 weeks of pregnancy. Follow these instructions at home:  Avoid all smoking, herbs, and alcohol. Avoid drugs not approved by your doctor.  Do not use any tobacco products, including cigarettes, chewing tobacco, and electronic cigarettes. If you need help quitting, ask your doctor. You may get counseling or other support to help you quit.  Only take medicine as told by your doctor. Some medicines are safe and some are not during pregnancy.  Exercise only as told by your doctor. Stop exercising if you start having cramps.  Eat regular, healthy meals.  Wear a good support bra if your breasts are tender.  Do not use hot tubs, steam rooms, or saunas.  Wear your seat belt when driving.  Avoid raw meat, uncooked cheese, and liter boxes and soil used by cats.  Take your prenatal vitamins.  Take 1500-2000 milligrams of calcium daily starting at the 20th week of pregnancy until you deliver your baby.  Try taking medicine that helps you poop (stool softener) as needed, and if your doctor approves. Eat more fiber by eating fresh fruit, vegetables, and whole grains. Drink enough fluids to keep your pee (urine) clear or pale yellow.  Take warm water baths (sitz baths) to soothe pain or discomfort caused by hemorrhoids. Use hemorrhoid cream if your doctor approves.  If you have puffy, bulging veins (varicose veins), wear support hose. Raise (elevate) your feet for 15 minutes, 3-4 times a day. Limit salt in your diet.  Avoid heavy  lifting, wear low heals, and sit up straight.  Rest with your legs raised if you have leg cramps or low back pain.  Visit your dentist if you have not gone during your pregnancy. Use a soft toothbrush to brush your teeth. Be gentle when you floss.  You can have sex (intercourse) unless your doctor tells you not to.  Go to your doctor visits. Get help if:  You feel dizzy.  You have mild cramps or pressure in your lower belly (abdomen).  You have a nagging pain in your belly area.  You continue to feel sick to your stomach (nauseous), throw up (vomit), or have watery poop (diarrhea).  You have bad smelling fluid coming from your vagina.  You have pain with peeing (urination). Get help right away if:  You have a fever.  You are leaking fluid from your vagina.  You have spotting or bleeding from your vagina.  You have severe belly cramping or pain.  You lose or gain weight rapidly.  You have trouble catching your breath and have chest pain.  You notice sudden or extreme puffiness (swelling) of your face, hands, ankles, feet, or legs.  You have not felt the baby move in over an hour.  You have severe headaches that do not go away with medicine.  You have vision changes. This information is not intended to replace advice given to you by your health care provider. Make sure you discuss any questions you have with your health care   provider. Document Released: 03/27/2009 Document Revised: 06/08/2015 Document Reviewed: 03/03/2012 Elsevier Interactive Patient Education  2017 Elsevier Inc.  

## 2017-01-15 NOTE — Progress Notes (Signed)
    Subjective:  Nancy Michael is a 27 y.o. G3P0020 at 217w3d being seen today for ongoing prenatal care.  She is currently monitored for the following issues for this low-risk pregnancy and has Supervision of other normal pregnancy, antepartum and Tobacco abuse on their problem list.  Patient reports feels pressure once a day when lying on her back..  Contractions: Not present. Vag. Bleeding: None.  Movement: Present. Denies leaking of fluid.   The following portions of the patient's history were reviewed and updated as appropriate: allergies, current medications, past family history, past medical history, past social history, past surgical history and problem list. Problem list updated.  Objective:   Vitals:   01/15/17 0844  BP: 99/66  Pulse: 91  Weight: 117 lb 3.2 oz (53.2 kg)    Fetal Status: Fetal Heart Rate (bpm): 149 Fundal Height: 24 cm Movement: Present     General:  Alert, oriented and cooperative. Patient is in no acute distress.  Skin: Skin is warm and dry. No rash noted.   Cardiovascular: Normal heart rate noted  Respiratory: Normal respiratory effort, no problems with respiration noted  Abdomen: Soft, gravid, appropriate for gestational age. Pain/Pressure: Present     Pelvic:  Cervical exam deferred        Extremities: Normal range of motion.  Edema: Trace  Mental Status: Normal mood and affect. Normal behavior. Normal judgment and thought content.    Assessment and Plan:  Pregnancy: G3P0020 at 5817w3d  Supervision on pregnancy Preterm labor symptoms and general obstetric precautions including but not limited to vaginal bleeding, contractions, leaking of fluid and fetal movement were reviewed in detail with the patient. Please refer to After Visit Summary for other counseling recommendations.  Return in about 4 weeks (around 02/12/2017).  Given list of pediatric providers and reviewed possible choices for her. Has taken breastfeeding classes and given [facility]  Encouraged to sign up.  Nolene BernheimERRI Eligio Angert, RN, MSN, NP-BC Nurse Practitioner, [facility], [facility] 01/15/2017 9:28 AM

## 2017-01-15 NOTE — Progress Notes (Signed)
Complains of constant pressure.

## 2017-02-12 ENCOUNTER — Other Ambulatory Visit: Payer: Medicaid Other

## 2017-02-12 ENCOUNTER — Ambulatory Visit (INDEPENDENT_AMBULATORY_CARE_PROVIDER_SITE_OTHER): Payer: Medicaid Other | Admitting: Certified Nurse Midwife

## 2017-02-12 VITALS — BP 107/69 | HR 69 | Wt 119.0 lb

## 2017-02-12 DIAGNOSIS — Z3482 Encounter for supervision of other normal pregnancy, second trimester: Secondary | ICD-10-CM

## 2017-02-12 DIAGNOSIS — Z23 Encounter for immunization: Secondary | ICD-10-CM | POA: Diagnosis not present

## 2017-02-12 DIAGNOSIS — Z3480 Encounter for supervision of other normal pregnancy, unspecified trimester: Secondary | ICD-10-CM

## 2017-02-12 NOTE — Patient Instructions (Addendum)
AREA PEDIATRIC/FAMILY PRACTICE PHYSICIANS  [facility] [address], [address] [place], [place]  [address] Phone - [phone]   Fax - [phone]  [facility] [address] [place], [place] [address] Phone - [phone]   Fax - [phone]  JACK AMOS [address] [place], [place]  [address] Phone - [phone]   Fax - [phone]  [facility] [address] [place], [place]  [address] Phone - [phone]   Fax - [phone]  [facility] [address] [place], [place]  [address] Phone - [phone]   Fax - [phone]  [facility] [address] [place], [place]  [address] Phone - [phone]   Fax - [phone]  [facility] [address] [place], [place]  [address] Phone - [phone]   Fax - [phone]  [facility] [address] [place], [place]  [address] Phone - [phone]   Fax - [phone]  [facility] [address] [place], [place]  [address] Phone - [phone]   Fax - [phone] [facility] [address] [place], [place]  [address] Phone - [phone]   Fax - [phone]  [facility] [address] [place], [place]  [address] Phone - [phone]   Fax - [phone]  [facility] [address] [place], [place]  [address] Phone - [phone]   Fax - [phone]  [facility] [address] [place], [place]  [address] Phone - [phone]   Fax - [phone]  SHILPA GOSRANI [address] [place], [place]  [address] Phone - [phone]  [facility] [address] [place], [place]  [address] Phone - [phone]   Fax - [phone]  [facility] [address] [place], [place]  [address] Phone - [phone]   Fax - [phone]  [facility] [address] [place], [place]  [address] Phone - [phone]   Fax - [phone]  [facility] [address] [place], [place]  [address] Phone - [phone]   Fax - [phone]   [facility] [address] [place], [place]  [address] Phone - [phone]   Fax - [phone]  [facility] [address] [place], [place]  [address] Phone - [phone]   Fax - [phone]  DAVID RUBIN [address] [place], [place]  [address] Phone - [phone]   Fax - [phone]  [facility] [address] [place], [place]  [address] Phone - [phone]   Fax - [phone]  [facility] [address] [place], [place]  [address] Phone - [phone]   Fax - [phone] [facility] [address] [place], [place]  [address] Phone - [phone]   Fax - [phone]  [facility] [address] [place], [place]  [address] Phone - [phone]   Fax - [phone]  [facility] [address] [place],   [address] Phone - [phone]   Fax - [phone]  [facility] Charlene Flemming MD [address] [place]  [address] Phone [phone]  Fax [phone]   Before Baby Comes Home Before your baby arrives it is important to:  Have   all of the supplies that you will need to care for your baby.  Know where to go if there is an emergency.  Discuss the baby's arrival with other family members.  What supplies will I need?  It is recommended that you have the following supplies: Large Items  Crib.  Crib mattress.  Rear-facing infant car seat. If possible, have a trained professional check to make sure that it is installed  correctly.  Feeding  6-8 bottles that are 4-5 oz in size.  6-8 nipples.  Bottle brush.  Sterilizer, or a large pan or kettle with a lid.  A way to boil and cool water.  If you will be breastfeeding: ? Breast pump. ? Nipple cream. ? Nursing bra. ? Breast pads. ? Breast shields.  If you will be formula feeding: ? Formula. ? Measuring cups. ? Measuring spoons.  Bathing  Mild baby soap and baby shampoo.  Petroleum jelly.  Soft cloth towel and washcloth.  Hooded towel.  Cotton balls.  Bath basin.  Other Supplies  Rectal thermometer.  Bulb syringe.  Baby wipes or washcloths for diaper changes.  Diaper bag.  Changing pad.  Clothing, including one-piece outfits and pajamas.  Baby nail clippers.  Receiving blankets.  Mattress pad and sheets for the crib.  Night-light for the baby's room.  Baby monitor.  2 or 3 pacifiers.  Either 24-36 cloth diapers and waterproof diaper covers or a box of disposable diapers. You may need to use as many as 10-12 diapers per day.  How do I prepare for an emergency? Prepare for an emergency by:  Knowing how to get to the nearest hospital.  Listing the phone numbers of your baby's health care providers near your home phone and in your cell phone.  How do I prepare my family?  Decide how to handle visitors.  If you have other children: ? Talk with them about the baby coming home. Ask them how they feel about it. ? Read a book together about being a new big brother or sister. ? Find ways to let them help you prepare for the new baby. ? Have someone ready to care for them while you are in the hospital. This information is not intended to replace advice given to you by your health care provider. Make sure you discuss any questions you have with your health care provider. Document Released: 12/14/2007 Document Revised: 06/08/2015 Document Reviewed: 12/08/2013 Elsevier Interactive Patient Education  2018 Elsevier  Inc.  Third Trimester of Pregnancy The third trimester is from week 29 through week 42, months 7 through 9. This trimester is when your unborn baby (fetus) is growing very fast. At the end of the ninth month, the unborn baby is about 20 inches in length. It weighs about 6-10 pounds. Follow these instructions at home:  Avoid all smoking, herbs, and alcohol. Avoid drugs not approved by your doctor.  Do not use any tobacco products, including cigarettes, chewing tobacco, and electronic cigarettes. If you need help quitting, ask your doctor. You may get counseling or other support to help you quit.  Only take medicine as told by your doctor. Some medicines are safe and some are not during pregnancy.  Exercise only as told by your doctor. Stop exercising if you start having cramps.  Eat regular, healthy meals.  Wear a good support bra if your breasts are tender.  Do not use hot tubs, steam rooms, or saunas.  Wear your seat belt when driving.  Avoid raw meat,   uncooked cheese, and liter boxes and soil used by cats.  Take your prenatal vitamins.  Take 1500-2000 milligrams of calcium daily starting at the 20th week of pregnancy until you deliver your baby.  Try taking medicine that helps you poop (stool softener) as needed, and if your doctor approves. Eat more fiber by eating fresh fruit, vegetables, and whole grains. Drink enough fluids to keep your pee (urine) clear or pale yellow.  Take warm water baths (sitz baths) to soothe pain or discomfort caused by hemorrhoids. Use hemorrhoid cream if your doctor approves.  If you have puffy, bulging veins (varicose veins), wear support hose. Raise (elevate) your feet for 15 minutes, 3-4 times a day. Limit salt in your diet.  Avoid heavy lifting, wear low heels, and sit up straight.  Rest with your legs raised if you have leg cramps or low back pain.  Visit your dentist if you have not gone during your pregnancy. Use a soft toothbrush to brush  your teeth. Be gentle when you floss.  You can have sex (intercourse) unless your doctor tells you not to.  Do not travel far distances unless you must. Only do so with your doctor's approval.  Take prenatal classes.  Practice driving to the hospital.  Pack your hospital bag.  Prepare the baby's room.  Go to your doctor visits. Get help if:  You are not sure if you are in labor or if your water has broken.  You are dizzy.  You have mild cramps or pressure in your lower belly (abdominal).  You have a nagging pain in your belly area.  You continue to feel sick to your stomach (nauseous), throw up (vomit), or have watery poop (diarrhea).  You have bad smelling fluid coming from your vagina.  You have pain with peeing (urination). Get help right away if:  You have a fever.  You are leaking fluid from your vagina.  You are spotting or bleeding from your vagina.  You have severe belly cramping or pain.  You lose or gain weight rapidly.  You have trouble catching your breath and have chest pain.  You notice sudden or extreme puffiness (swelling) of your face, hands, ankles, feet, or legs.  You have not felt the baby move in over an hour.  You have severe headaches that do not go away with medicine.  You have vision changes. This information is not intended to replace advice given to you by your health care provider. Make sure you discuss any questions you have with your health care provider. Document Released: 03/27/2009 Document Revised: 06/08/2015 Document Reviewed: 03/03/2012 Elsevier Interactive Patient Education  2017 Elsevier Inc.  [employer] of the uterus can occur throughout pregnancy, but they are not always a sign that you are in labor. You may have practice contractions called Braxton Hicks contractions. These false labor contractions are sometimes confused with true labor. What are Deberah Pelton contractions? Braxton Hicks  contractions are tightening movements that occur in the muscles of the uterus before labor. Unlike true labor contractions, these contractions do not result in opening (dilation) and thinning of the cervix. Toward the end of pregnancy (32-34 weeks), Braxton Hicks contractions can happen more often and may become stronger. These contractions are sometimes difficult to tell apart from true labor because they can be very uncomfortable. You should not feel embarrassed if you go to the hospital with false labor. Sometimes, the only way to tell if you are in true labor is for  your health care provider to look for changes in the cervix. The health care provider will do a physical exam and may monitor your contractions. If you are not in true labor, the exam should show that your cervix is not dilating and your water has not broken. If there are other health problems associated with your pregnancy, it is completely safe for you to be sent home with false labor. You may continue to have Braxton Hicks contractions until you go into true labor. How to tell the difference between true labor and false labor True labor  Contractions last 30-70 seconds.  Contractions become very regular.  Discomfort is usually felt in the top of the uterus, and it spreads to the lower abdomen and low back.  Contractions do not go away with walking.  Contractions usually become more intense and increase in frequency.  The cervix dilates and gets thinner. False labor  Contractions are usually shorter and not as strong as true labor contractions.  Contractions are usually irregular.  Contractions are often felt in the front of the lower abdomen and in the groin.  Contractions may go away when you walk around or change positions while lying down.  Contractions get weaker and are shorter-lasting as time goes on.  The cervix usually does not dilate or become thin. Follow these instructions at home:  Take over-the-counter  and prescription medicines only as told by your health care provider.  Keep up with your usual exercises and follow other instructions from your health care provider.  Eat and drink lightly if you think you are going into labor.  If Braxton Hicks contractions are making you uncomfortable: ? Change your position from lying down or resting to walking, or change from walking to resting. ? Sit and rest in a tub of warm water. ? Drink enough fluid to keep your urine pale yellow. Dehydration may cause these contractions. ? Do slow and deep breathing several times an hour.  Keep all follow-up prenatal visits as told by your health care provider. This is important. Contact a health care provider if:  You have a fever.  You have continuous pain in your abdomen. Get help right away if:  Your contractions become stronger, more regular, and closer together.  You have fluid leaking or gushing from your vagina.  You pass blood-tinged mucus (bloody show).  You have bleeding from your vagina.  You have low back pain that you never had before.  You feel your baby's head pushing down and causing pelvic pressure.  Your baby is not moving inside you as much as it used to. Summary  Contractions that occur before labor are called Braxton Hicks contractions, false labor, or practice contractions.  Braxton Hicks contractions are usually shorter, weaker, farther apart, and less regular than true labor contractions. True labor contractions usually become progressively stronger and regular and they become more frequent.  Manage discomfort from [facility] contractions by changing position, resting in a warm bath, drinking plenty of water, or practicing deep breathing. This information is not intended to replace advice given to you by your health care provider. Make sure you discuss any questions you have with your health care provider. Document Released: 05/16/2016 Document Revised: 05/16/2016  Document Reviewed: 05/16/2016 Elsevier Interactive Patient Education  2018 [employer]

## 2017-02-12 NOTE — Progress Notes (Signed)
Patient reports good fetal movement with occasional uterine irritability. 

## 2017-02-12 NOTE — Progress Notes (Signed)
   PRENATAL VISIT NOTE  Subjective:  Nancy Michael is a 27 y.o. G3P0020 at 4820w3d being seen today for ongoing prenatal care.  She is currently monitored for the following issues for this low-risk pregnancy and has Supervision of other normal pregnancy, antepartum and Tobacco abuse on their problem list.  Patient reports no complaints.  Contractions: Irritability. Vag. Bleeding: None.  Movement: Absent. Denies leaking of fluid.   The following portions of the patient's history were reviewed and updated as appropriate: allergies, current medications, past family history, past medical history, past social history, past surgical history and problem list. Problem list updated.  Objective:   Vitals:   02/12/17 0846  BP: 107/69  Pulse: 69  Weight: 119 lb (54 kg)    Fetal Status: Fetal Heart Rate (bpm): 145; doppler Fundal Height: 27 cm Movement: Absent     General:  Alert, oriented and cooperative. Patient is in no acute distress.  Skin: Skin is warm and dry. No rash noted.   Cardiovascular: Normal heart rate noted  Respiratory: Normal respiratory effort, no problems with respiration noted  Abdomen: Soft, gravid, appropriate for gestational age.  Pain/Pressure: Absent     Pelvic: Cervical exam deferred        Extremities: Normal range of motion.  Edema: None  Mental Status:  Normal mood and affect. Normal behavior. Normal judgment and thought content.   Assessment and Plan:  Pregnancy: G3P0020 at 3520w3d  1. Supervision of other normal pregnancy, antepartum    Doing well - Glucose Tolerance, 2 Hours w/1 Hour - CBC - HIV antibody (with reflex) - RPR  Preterm labor symptoms and general obstetric precautions including but not limited to vaginal bleeding, contractions, leaking of fluid and fetal movement were reviewed in detail with the patient. Please refer to After Visit Summary for other counseling recommendations.  Return in about 2 weeks (around 02/26/2017) for ROB.   Roe Coombsachelle A  Zygmunt Mcglinn, CNM

## 2017-02-13 ENCOUNTER — Other Ambulatory Visit: Payer: Self-pay | Admitting: Certified Nurse Midwife

## 2017-02-13 DIAGNOSIS — Z3480 Encounter for supervision of other normal pregnancy, unspecified trimester: Secondary | ICD-10-CM

## 2017-02-13 LAB — CBC
Hematocrit: 38 % (ref 34.0–46.6)
Hemoglobin: 12.2 g/dL (ref 11.1–15.9)
MCH: 31.7 pg (ref 26.6–33.0)
MCHC: 32.1 g/dL (ref 31.5–35.7)
MCV: 99 fL — AB (ref 79–97)
PLATELETS: 360 10*3/uL (ref 150–379)
RBC: 3.85 x10E6/uL (ref 3.77–5.28)
RDW: 13.9 % (ref 12.3–15.4)
WBC: 11.7 10*3/uL — AB (ref 3.4–10.8)

## 2017-02-13 LAB — GLUCOSE TOLERANCE, 2 HOURS W/ 1HR
GLUCOSE, 1 HOUR: 116 mg/dL (ref 65–179)
GLUCOSE, FASTING: 72 mg/dL (ref 65–91)
Glucose, 2 hour: 40 mg/dL — ABNORMAL LOW (ref 65–152)

## 2017-02-13 LAB — HIV ANTIBODY (ROUTINE TESTING W REFLEX): HIV SCREEN 4TH GENERATION: NONREACTIVE

## 2017-02-13 LAB — RPR: RPR Ser Ql: NONREACTIVE

## 2017-02-26 ENCOUNTER — Ambulatory Visit (INDEPENDENT_AMBULATORY_CARE_PROVIDER_SITE_OTHER): Payer: Medicaid Other | Admitting: Certified Nurse Midwife

## 2017-02-26 ENCOUNTER — Encounter: Payer: Self-pay | Admitting: Certified Nurse Midwife

## 2017-02-26 DIAGNOSIS — Z3480 Encounter for supervision of other normal pregnancy, unspecified trimester: Secondary | ICD-10-CM

## 2017-02-26 NOTE — Progress Notes (Signed)
   PRENATAL VISIT NOTE  Subjective:  Linus SalmonsSharae S Paule is a 27 y.o. G3P0020 at 1059w3d being seen today for ongoing prenatal care.  She is currently monitored for the following issues for this low-risk pregnancy and has Supervision of other normal pregnancy, antepartum and Tobacco abuse on their problem list.  Patient reports backache, no contractions, no cramping, no leaking and occasional contractions.  Contractions: Irritability. Vag. Bleeding: None.  Movement: Present. Denies leaking of fluid.   The following portions of the patient's history were reviewed and updated as appropriate: allergies, current medications, past family history, past medical history, past social history, past surgical history and problem list. Problem list updated.  Objective:   Vitals:   02/26/17 0842  BP: 130/75  Pulse: 86  Weight: 122 lb (55.3 kg)    Fetal Status: Fetal Heart Rate (bpm): 138; doppler Fundal Height: 27 cm Movement: Present     General:  Alert, oriented and cooperative. Patient is in no acute distress.  Skin: Skin is warm and dry. No rash noted.   Cardiovascular: Normal heart rate noted  Respiratory: Normal respiratory effort, no problems with respiration noted  Abdomen: Soft, gravid, appropriate for gestational age.  Pain/Pressure: Present     Pelvic: Cervical exam deferred        Extremities: Normal range of motion.  Edema: None  Mental Status:  Normal mood and affect. Normal behavior. Normal judgment and thought content.   Assessment and Plan:  Pregnancy: G3P0020 at 2559w3d  1. Supervision of other normal pregnancy, antepartum     Doing well.  Reports decreased appetite.  F/U US scheduled for 03/05/17.   Preterm labor symptoms and general obstetric precautions including but not limited to vaginal bleeding, contractions, leaking of fluid and fetal movement were reviewed in detail with the patient. Please refer to After Visit Summary for other counseling recommendations.  Return in about 2  weeks (around 03/12/2017) for ROB.   Roe Coombsachelle A Destinae Neubecker, CNM

## 2017-02-26 NOTE — Progress Notes (Signed)
2 hr GTT done on 02/12/17 Up to date on Tdap vaccine.   ZO:XWRU/[id number]:pain/pressure no pain w/urination.  Pt states she had a episode at work where she had increased pain   Pt states pain worsens when she is on her feet at work for long hours.

## 2017-02-26 NOTE — Patient Instructions (Signed)
AREA PEDIATRIC/FAMILY PRACTICE PHYSICIANS  [facility] [address], [address] [place], [place]  [address] Phone - [phone]   Fax - [phone]  [facility] [address] [place], [place] [address] Phone - [phone]   Fax - [phone]  JACK AMOS [address] [place], [place]  [address] Phone - [phone]   Fax - [phone]  [facility] [address] [place], [place]  [address] Phone - [phone]   Fax - [phone]  [facility] [address] [place], [place]  [address] Phone - [phone]   Fax - [phone]  [facility] [address] [place], [place]  [address] Phone - [phone]   Fax - [phone]  [facility] [place] [address] [place], [place]  [address] Phone - [phone]   Fax - [phone]  [facility] [address] [place], [place]  [address] Phone - [phone]   Fax - [phone]  [facility] [address] [place], [place]  [address] Phone - [phone]   Fax - [phone] [facility] [address] [place], [place]  [address] Phone - [phone]   Fax - [phone]  [facility] [address] [place], [place]  [address] Phone - [phone]   Fax - [phone]  [facility] [address] [place], [place]  [address] Phone - [phone]   Fax - [phone]  [facility] [address] [place], [place]  [address] Phone - [phone]   Fax - [phone]  SHILPA GOSRANI [address] [place], [place]  [address] Phone - [phone]  [facility] [address] [place], [place]  [address] Phone - [phone]   Fax - [phone]  [facility] [address] [place], [place]  [address] Phone - [phone]   Fax - [phone]  [facility] [address] [place], [place]  [address] Phone - [phone]   Fax - [phone]  [facility] [address] [place], [place]  [address] Phone - [phone]   Fax - [phone]   [facility] [address] [place], [place]  [address] Phone - [phone]   Fax - [phone]  [facility] [address] [place], [place]  [address] Phone - [phone]   Fax - [phone]  [facility] RUBIN [address] [place], [place]  [address] Phone - [phone]   Fax - [phone]  [facility] [address] [place], [place]  [address] Phone - [phone]   Fax - [phone]  [facility] [address] , [place]  [address] Phone - [phone]   Fax - [phone] [facility] [address] [place], [place]  [address] Phone - [phone]   Fax - [phone]  [facility] [address] [place],   [address] Phone - [phone]   Fax - [phone]  [facility] [place] [address] [place],   [address] Phone - [phone]   Fax - [phone]  [facility] [place] Charlene Flemming MD [address] [place]  [address] Phone [phone]  Fax [phone]   

## 2017-03-05 ENCOUNTER — Other Ambulatory Visit: Payer: Self-pay | Admitting: Certified Nurse Midwife

## 2017-03-05 ENCOUNTER — Ambulatory Visit (HOSPITAL_COMMUNITY)
Admission: RE | Admit: 2017-03-05 | Discharge: 2017-03-05 | Disposition: A | Discharge status: Home / Self Care | Payer: Medicaid Other | Source: Ambulatory Visit | Attending: Certified Nurse Midwife | Admitting: Certified Nurse Midwife

## 2017-03-05 DIAGNOSIS — O365931 Maternal care for other known or suspected poor fetal growth, third trimester, fetus 1: Secondary | ICD-10-CM

## 2017-03-05 DIAGNOSIS — Z362 Encounter for other antenatal screening follow-up: Secondary | ICD-10-CM | POA: Insufficient documentation

## 2017-03-05 DIAGNOSIS — O99333 Smoking (tobacco) complicating pregnancy, third trimester: Secondary | ICD-10-CM | POA: Diagnosis not present

## 2017-03-05 DIAGNOSIS — Z3480 Encounter for supervision of other normal pregnancy, unspecified trimester: Secondary | ICD-10-CM

## 2017-03-05 DIAGNOSIS — O365930 Maternal care for other known or suspected poor fetal growth, third trimester, not applicable or unspecified: Secondary | ICD-10-CM | POA: Insufficient documentation

## 2017-03-05 DIAGNOSIS — Z3A30 30 weeks gestation of pregnancy: Secondary | ICD-10-CM | POA: Diagnosis not present

## 2017-03-05 DIAGNOSIS — IMO0002 Reserved for concepts with insufficient information to code with codable children: Secondary | ICD-10-CM

## 2017-03-05 DIAGNOSIS — Z0489 Encounter for examination and observation for other specified reasons: Secondary | ICD-10-CM

## 2017-03-07 ENCOUNTER — Other Ambulatory Visit: Payer: Self-pay | Admitting: Certified Nurse Midwife

## 2017-03-07 ENCOUNTER — Encounter: Payer: Self-pay | Admitting: Certified Nurse Midwife

## 2017-03-07 DIAGNOSIS — O365930 Maternal care for other known or suspected poor fetal growth, third trimester, not applicable or unspecified: Secondary | ICD-10-CM

## 2017-03-07 NOTE — Progress Notes (Signed)
Please schedule patient for Weekly NSTs with weekly AFI.  Thank you. Marcia Hartwell

## 2017-03-10 ENCOUNTER — Other Ambulatory Visit: Payer: Self-pay | Admitting: Certified Nurse Midwife

## 2017-03-10 ENCOUNTER — Other Ambulatory Visit: Payer: Medicaid Other

## 2017-03-11 ENCOUNTER — Other Ambulatory Visit: Payer: Self-pay | Admitting: Certified Nurse Midwife

## 2017-03-11 DIAGNOSIS — O365930 Maternal care for other known or suspected poor fetal growth, third trimester, not applicable or unspecified: Secondary | ICD-10-CM

## 2017-03-12 ENCOUNTER — Other Ambulatory Visit: Payer: Medicaid Other

## 2017-03-12 ENCOUNTER — Encounter: Payer: Self-pay | Admitting: Certified Nurse Midwife

## 2017-03-12 ENCOUNTER — Ambulatory Visit (INDEPENDENT_AMBULATORY_CARE_PROVIDER_SITE_OTHER): Payer: Medicaid Other | Admitting: Certified Nurse Midwife

## 2017-03-12 VITALS — BP 115/66 | HR 90 | Wt 122.4 lb

## 2017-03-12 DIAGNOSIS — Z3483 Encounter for supervision of other normal pregnancy, third trimester: Secondary | ICD-10-CM

## 2017-03-12 DIAGNOSIS — Z3480 Encounter for supervision of other normal pregnancy, unspecified trimester: Secondary | ICD-10-CM

## 2017-03-12 DIAGNOSIS — Z720 Tobacco use: Secondary | ICD-10-CM

## 2017-03-12 DIAGNOSIS — O365930 Maternal care for other known or suspected poor fetal growth, third trimester, not applicable or unspecified: Secondary | ICD-10-CM

## 2017-03-12 NOTE — Progress Notes (Signed)
NST Dx IUGR Low Normal AFI

## 2017-03-12 NOTE — Progress Notes (Signed)
   PRENATAL VISIT NOTE  Subjective:  Nancy Michael is a 27 y.o. G3P0020 at 4549w3d being seen today for ongoing prenatal care.  She is currently monitored for the following issues for this high-risk pregnancy and has Supervision of other normal pregnancy, antepartum and Tobacco abuse on their problem list.  Patient reports no complaints.  Contractions: Irritability. Vag. Bleeding: None.  Movement: Present. Denies leaking of fluid.   The following portions of the patient's history were reviewed and updated as appropriate: allergies, current medications, past family history, past medical history, past social history, past surgical history and problem list. Problem list updated.  Objective:   Vitals:   03/12/17 0848  BP: 115/66  Pulse: 90  Weight: 122 lb 6.4 oz (55.5 kg)    Fetal Status: Fetal Heart Rate (bpm): NST; reactive Fundal Height: 30 cm Movement: Present     General:  Alert, oriented and cooperative. Patient is in no acute distress.  Skin: Skin is warm and dry. No rash noted.   Cardiovascular: Normal heart rate noted  Respiratory: Normal respiratory effort, no problems with respiration noted  Abdomen: Soft, gravid, appropriate for gestational age.  Pain/Pressure: Present     Pelvic: Cervical exam deferred        Extremities: Normal range of motion.  Edema: None  Mental Status:  Normal mood and affect. Normal behavior. Normal judgment and thought content.  NST: + accels, no decels, moderate variability, Cat. 1 tracing. No contractions on toco.   Assessment and Plan:  Pregnancy: G3P0020 at 6649w3d  1. Tobacco abuse        2. Supervision of other normal pregnancy, antepartum      Changed to high risk d/t last US on 03/05/17.  IUGR with low AFI.   3. Poor fetal growth affecting management of mother in third trimester, single or unspecified fetus   Has BPP scheduled for 03/14/17.   AFI: 10.56 today in office     Reactive NST - Fetal nonstress test; Future - US OB  Limited  Preterm labor symptoms and general obstetric precautions including but not limited to vaginal bleeding, contractions, leaking of fluid and fetal movement were reviewed in detail with the patient. Please refer to After Visit Summary for other counseling recommendations.  Return in about 1 week (around 03/19/2017) [facility]; NST weekly.   Roe Coombsachelle A Denney, CNM

## 2017-03-14 ENCOUNTER — Ambulatory Visit (HOSPITAL_COMMUNITY): Payer: Medicaid Other

## 2017-03-17 ENCOUNTER — Other Ambulatory Visit: Payer: Self-pay | Admitting: Certified Nurse Midwife

## 2017-03-17 ENCOUNTER — Ambulatory Visit (HOSPITAL_COMMUNITY)
Admission: RE | Admit: 2017-03-17 | Discharge: 2017-03-17 | Disposition: A | Discharge status: Home / Self Care | Payer: Medicaid Other | Source: Ambulatory Visit | Attending: Certified Nurse Midwife | Admitting: Certified Nurse Midwife

## 2017-03-17 DIAGNOSIS — O99333 Smoking (tobacco) complicating pregnancy, third trimester: Secondary | ICD-10-CM | POA: Insufficient documentation

## 2017-03-17 DIAGNOSIS — Z3A32 32 weeks gestation of pregnancy: Secondary | ICD-10-CM | POA: Diagnosis not present

## 2017-03-17 DIAGNOSIS — O09293 Supervision of pregnancy with other poor reproductive or obstetric history, third trimester: Secondary | ICD-10-CM | POA: Insufficient documentation

## 2017-03-17 DIAGNOSIS — O365930 Maternal care for other known or suspected poor fetal growth, third trimester, not applicable or unspecified: Secondary | ICD-10-CM

## 2017-03-19 ENCOUNTER — Encounter: Payer: Self-pay | Admitting: Certified Nurse Midwife

## 2017-03-19 ENCOUNTER — Ambulatory Visit (INDEPENDENT_AMBULATORY_CARE_PROVIDER_SITE_OTHER): Payer: Medicaid Other | Admitting: Certified Nurse Midwife

## 2017-03-19 VITALS — BP 116/75 | HR 81 | Wt 124.6 lb

## 2017-03-19 DIAGNOSIS — O365930 Maternal care for other known or suspected poor fetal growth, third trimester, not applicable or unspecified: Secondary | ICD-10-CM

## 2017-03-19 DIAGNOSIS — O0990 Supervision of high risk pregnancy, unspecified, unspecified trimester: Secondary | ICD-10-CM

## 2017-03-19 DIAGNOSIS — O0993 Supervision of high risk pregnancy, unspecified, third trimester: Secondary | ICD-10-CM

## 2017-03-19 HISTORY — DX: Maternal care for other known or suspected poor fetal growth, third trimester, not applicable or unspecified: O36.5930

## 2017-03-19 NOTE — Progress Notes (Signed)
   PRENATAL VISIT NOTE  Subjective:  Linus SalmonsSharae S Delair is a 27 y.o. G3P0020 at 8245w3d being seen today for ongoing prenatal care.  She is currently monitored for the following issues for this high-risk pregnancy and has Supervision of high risk pregnancy, antepartum; Tobacco abuse; and Poor fetal growth affecting management of mother in third trimester on their problem list.  Patient reports no complaints.  Contractions: Irritability. Vag. Bleeding: None.  Movement: Present. Denies leaking of fluid.   The following portions of the patient's history were reviewed and updated as appropriate: allergies, current medications, past family history, past medical history, past social history, past surgical history and problem list. Problem list updated.  Objective:   Vitals:   03/19/17 0847  BP: 116/75  Pulse: 81  Weight: 124 lb 9.6 oz (56.5 kg)    Fetal Status:     Movement: Present     General:  Alert, oriented and cooperative. Patient is in no acute distress.  Skin: Skin is warm and dry. No rash noted.   Cardiovascular: Normal heart rate noted  Respiratory: Normal respiratory effort, no problems with respiration noted  Abdomen: Soft, gravid, appropriate for gestational age.  Pain/Pressure: Present     Pelvic: Cervical exam deferred        Extremities: Normal range of motion.  Edema: None  Mental Status:  Normal mood and affect. Normal behavior. Normal judgment and thought content.  NST: + accels, no decels, moderate variability, Cat. 1 tracing. No contractions on toco.   Assessment and Plan:  Pregnancy: G3P0020 at 3045w3d  1. Supervision of high risk pregnancy, antepartum       Reactive NST  2. Poor fetal growth affecting management of mother in third trimester, single or unspecified fetus    [facility]  - Fetal nonstress test - US MFM FETAL BPP WO NON STRESS; Future  Preterm labor symptoms and general obstetric precautions including but not limited to vaginal bleeding, contractions,  leaking of fluid and fetal movement were reviewed in detail with the patient. Please refer to After Visit Summary for other counseling recommendations.  [facility] (around [facility]26/2017[facility]; NST weekly, Needs to see FP MD here.   Roe Coombsachelle A Denney, CNM

## 2017-03-24 ENCOUNTER — Encounter: Payer: Self-pay | Admitting: *Deleted

## 2017-03-26 ENCOUNTER — Encounter: Payer: Self-pay | Admitting: Obstetrics and Gynecology

## 2017-03-26 ENCOUNTER — Ambulatory Visit (INDEPENDENT_AMBULATORY_CARE_PROVIDER_SITE_OTHER): Payer: Medicaid Other | Admitting: Obstetrics and Gynecology

## 2017-03-26 ENCOUNTER — Encounter (HOSPITAL_COMMUNITY): Payer: Self-pay

## 2017-03-26 ENCOUNTER — Ambulatory Visit (HOSPITAL_COMMUNITY)
Admission: RE | Admit: 2017-03-26 | Discharge: 2017-03-26 | Disposition: A | Discharge status: Home / Self Care | Payer: Medicaid Other | Source: Ambulatory Visit | Attending: Certified Nurse Midwife | Admitting: Certified Nurse Midwife

## 2017-03-26 ENCOUNTER — Other Ambulatory Visit (HOSPITAL_COMMUNITY): Payer: Self-pay | Admitting: *Deleted

## 2017-03-26 ENCOUNTER — Other Ambulatory Visit: Payer: Self-pay | Admitting: Certified Nurse Midwife

## 2017-03-26 VITALS — BP 118/71 | HR 82 | Wt 128.0 lb

## 2017-03-26 DIAGNOSIS — O99333 Smoking (tobacco) complicating pregnancy, third trimester: Secondary | ICD-10-CM | POA: Insufficient documentation

## 2017-03-26 DIAGNOSIS — Z3A33 33 weeks gestation of pregnancy: Secondary | ICD-10-CM | POA: Insufficient documentation

## 2017-03-26 DIAGNOSIS — O09293 Supervision of pregnancy with other poor reproductive or obstetric history, third trimester: Secondary | ICD-10-CM | POA: Diagnosis not present

## 2017-03-26 DIAGNOSIS — O0990 Supervision of high risk pregnancy, unspecified, unspecified trimester: Secondary | ICD-10-CM

## 2017-03-26 DIAGNOSIS — O35EXX0 Maternal care for other (suspected) fetal abnormality and damage, fetal genitourinary anomalies, not applicable or unspecified: Secondary | ICD-10-CM | POA: Insufficient documentation

## 2017-03-26 DIAGNOSIS — O365930 Maternal care for other known or suspected poor fetal growth, third trimester, not applicable or unspecified: Secondary | ICD-10-CM | POA: Diagnosis not present

## 2017-03-26 DIAGNOSIS — O0993 Supervision of high risk pregnancy, unspecified, third trimester: Secondary | ICD-10-CM

## 2017-03-26 DIAGNOSIS — Q614 Renal dysplasia: Secondary | ICD-10-CM

## 2017-03-26 DIAGNOSIS — O358XX0 Maternal care for other (suspected) fetal abnormality and damage, not applicable or unspecified: Secondary | ICD-10-CM | POA: Insufficient documentation

## 2017-03-26 NOTE — Progress Notes (Signed)
Subjective:  Nancy Michael is a 27 y.o. G3P0020 at 6945w3d being seen today for ongoing prenatal care.  She is currently monitored for the following issues for this high-risk pregnancy and has Supervision of high risk pregnancy, antepartum; Tobacco abuse; Poor fetal growth affecting management of mother in third trimester; and Fetal renal anomaly, single gestation on their problem list.  Patient reports no complaints.  Contractions: Irregular. Vag. Bleeding: None.  Movement: Present. Denies leaking of fluid.   The following portions of the patient's history were reviewed and updated as appropriate: allergies, current medications, past family history, past medical history, past social history, past surgical history and problem list. Problem list updated.  Objective:   Vitals:   03/26/17 1051  BP: 118/71  Pulse: 82  Weight: 128 lb (58.1 kg)    Fetal Status: Fetal Heart Rate (bpm): 138   Movement: Present     General:  Alert, oriented and cooperative. Patient is in no acute distress.  Skin: Skin is warm and dry. No rash noted.   Cardiovascular: Normal heart rate noted  Respiratory: Normal respiratory effort, no problems with respiration noted  Abdomen: Soft, gravid, appropriate for gestational age. Pain/Pressure: Present     Pelvic:  Cervical exam deferred        Extremities: Normal range of motion.  Edema: None  Mental Status: Normal mood and affect. Normal behavior. Normal judgment and thought content.   Urinalysis:      Assessment and Plan:  Pregnancy: G3P0020 at 2745w3d  1. Supervision of high risk pregnancy, antepartum Stable  2. Poor fetal growth affecting management of mother in third trimester, single or unspecified fetus Growth scan today Normal dopplers  3. Fetal renal anomaly, single gestation Right fetal kidney possible obstruction Will nee Peds Urology eval after delivery  Preterm labor symptoms and general obstetric precautions including but not limited to vaginal  bleeding, contractions, leaking of fluid and fetal movement were reviewed in detail with the patient. Please refer to After Visit Summary for other counseling recommendations.  Return in about 1 week (around 04/02/2017) for OB visit.   Hermina StaggersErvin, Camara Rosander L, MD

## 2017-04-02 ENCOUNTER — Ambulatory Visit (INDEPENDENT_AMBULATORY_CARE_PROVIDER_SITE_OTHER): Payer: Medicaid Other | Admitting: Obstetrics & Gynecology

## 2017-04-02 ENCOUNTER — Encounter: Payer: Self-pay | Admitting: Obstetrics & Gynecology

## 2017-04-02 ENCOUNTER — Ambulatory Visit (HOSPITAL_COMMUNITY)
Admission: RE | Admit: 2017-04-02 | Discharge: 2017-04-02 | Disposition: A | Discharge status: Home / Self Care | Payer: Medicaid Other | Source: Ambulatory Visit | Attending: Certified Nurse Midwife | Admitting: Certified Nurse Midwife

## 2017-04-02 ENCOUNTER — Other Ambulatory Visit (HOSPITAL_COMMUNITY): Payer: Self-pay | Admitting: *Deleted

## 2017-04-02 ENCOUNTER — Encounter (HOSPITAL_COMMUNITY): Payer: Self-pay

## 2017-04-02 VITALS — BP 113/73 | HR 87 | Wt 128.9 lb

## 2017-04-02 DIAGNOSIS — Q614 Renal dysplasia: Secondary | ICD-10-CM | POA: Diagnosis present

## 2017-04-02 DIAGNOSIS — O359XX0 Maternal care for (suspected) fetal abnormality and damage, unspecified, not applicable or unspecified: Secondary | ICD-10-CM

## 2017-04-02 DIAGNOSIS — O35EXX0 Maternal care for other (suspected) fetal abnormality and damage, fetal genitourinary anomalies, not applicable or unspecified: Secondary | ICD-10-CM

## 2017-04-02 DIAGNOSIS — O358XX0 Maternal care for other (suspected) fetal abnormality and damage, not applicable or unspecified: Secondary | ICD-10-CM | POA: Diagnosis not present

## 2017-04-02 DIAGNOSIS — O0990 Supervision of high risk pregnancy, unspecified, unspecified trimester: Secondary | ICD-10-CM

## 2017-04-02 DIAGNOSIS — Z3A34 34 weeks gestation of pregnancy: Secondary | ICD-10-CM | POA: Diagnosis not present

## 2017-04-02 DIAGNOSIS — O365930 Maternal care for other known or suspected poor fetal growth, third trimester, not applicable or unspecified: Secondary | ICD-10-CM | POA: Insufficient documentation

## 2017-04-02 NOTE — Patient Instructions (Signed)

## 2017-04-02 NOTE — Progress Notes (Signed)
   PRENATAL VISIT NOTE  Subjective:  Nancy Michael is a 27 y.o. G3P0020 at 6266w3d being seen today for ongoing prenatal care.  She is currently monitored for the following issues for this high-risk pregnancy and has Supervision of high risk pregnancy, antepartum; Tobacco abuse; Poor fetal growth affecting management of mother in third trimester; and Fetal renal anomaly, single gestation on their problem list.  Patient reports no complaints.  Contractions: Irregular. Vag. Bleeding: None.  Movement: Present. Denies leaking of fluid.   The following portions of the patient's history were reviewed and updated as appropriate: allergies, current medications, past family history, past medical history, past social history, past surgical history and problem list. Problem list updated.  Objective:   Vitals:   04/02/17 1006  BP: 113/73  Pulse: 87  Weight: 128 lb 14.4 oz (58.5 kg)    Fetal Status:     Movement: Present     General:  Alert, oriented and cooperative. Patient is in no acute distress.  Skin: Skin is warm and dry. No rash noted.   Cardiovascular: Normal heart rate noted  Respiratory: Normal respiratory effort, no problems with respiration noted  Abdomen: Soft, gravid, appropriate for gestational age.  Pain/Pressure: Present     Pelvic: Cervical exam deferred        Extremities: Normal range of motion.  Edema: None  Mental Status:  Normal mood and affect. Normal behavior. Normal judgment and thought content.   Assessment and Plan:  Pregnancy: G3P0020 at 3966w3d  1. Supervision of high risk pregnancy, antepartum US growth in 2 weeks, normal BPP and dopplers today F/u next week is scheduled 2. Fetal renal anomaly, single gestation F/u as indicated  Preterm labor symptoms and general obstetric precautions including but not limited to vaginal bleeding, contractions, leaking of fluid and fetal movement were reviewed in detail with the patient. Please refer to After Visit Summary for  other counseling recommendations.  Return in about 2 weeks (around 04/16/2017).   Scheryl DarterJames Siaosi Alter, MD

## 2017-04-09 ENCOUNTER — Other Ambulatory Visit (HOSPITAL_COMMUNITY): Payer: Self-pay | Admitting: Maternal and Fetal Medicine

## 2017-04-09 ENCOUNTER — Ambulatory Visit (HOSPITAL_COMMUNITY)
Admission: RE | Admit: 2017-04-09 | Discharge: 2017-04-09 | Disposition: A | Discharge status: Home / Self Care | Payer: Medicaid Other | Source: Ambulatory Visit | Attending: Certified Nurse Midwife | Admitting: Certified Nurse Midwife

## 2017-04-09 ENCOUNTER — Encounter (HOSPITAL_COMMUNITY): Payer: Self-pay

## 2017-04-09 DIAGNOSIS — Z3A35 35 weeks gestation of pregnancy: Secondary | ICD-10-CM | POA: Insufficient documentation

## 2017-04-09 DIAGNOSIS — O99333 Smoking (tobacco) complicating pregnancy, third trimester: Secondary | ICD-10-CM | POA: Insufficient documentation

## 2017-04-09 DIAGNOSIS — Q614 Renal dysplasia: Secondary | ICD-10-CM

## 2017-04-09 DIAGNOSIS — O358XX0 Maternal care for other (suspected) fetal abnormality and damage, not applicable or unspecified: Secondary | ICD-10-CM | POA: Diagnosis not present

## 2017-04-09 DIAGNOSIS — O09293 Supervision of pregnancy with other poor reproductive or obstetric history, third trimester: Secondary | ICD-10-CM | POA: Diagnosis not present

## 2017-04-09 DIAGNOSIS — O365930 Maternal care for other known or suspected poor fetal growth, third trimester, not applicable or unspecified: Secondary | ICD-10-CM | POA: Insufficient documentation

## 2017-04-09 NOTE — Procedures (Signed)
Linus SalmonsSharae S Cumbo 08/26/1990 2823w3d  Fetus A Non-Stress Test Interpretation for 04/09/17  Indication: Unsatisfactory BPP  Fetal Heart Rate A Mode: External Baseline Rate (A): 125 bpm Variability: Moderate Accelerations: 15 x 15 Decelerations: None Multiple birth?: No  Uterine Activity Mode: Palpation, Toco Contraction Frequency (min): None Resting Tone Palpated: Relaxed Resting Time: Adequate  Interpretation (Fetal Testing) Nonstress Test Interpretation: Reactive Comments: EFM tracing reviewed by Dr. Ezzard StandingNewman

## 2017-04-16 ENCOUNTER — Other Ambulatory Visit (HOSPITAL_COMMUNITY)
Admission: RE | Admit: 2017-04-16 | Discharge: 2017-04-16 | Disposition: A | Discharge status: Home / Self Care | Payer: Medicaid Other | Source: Ambulatory Visit | Attending: Obstetrics & Gynecology | Admitting: Obstetrics & Gynecology

## 2017-04-16 ENCOUNTER — Telehealth (HOSPITAL_COMMUNITY): Payer: Self-pay | Admitting: *Deleted

## 2017-04-16 ENCOUNTER — Encounter (HOSPITAL_COMMUNITY): Payer: Self-pay

## 2017-04-16 ENCOUNTER — Ambulatory Visit (HOSPITAL_COMMUNITY)
Admission: RE | Admit: 2017-04-16 | Discharge: 2017-04-16 | Disposition: A | Discharge status: Home / Self Care | Payer: Medicaid Other | Source: Ambulatory Visit | Attending: Certified Nurse Midwife | Admitting: Certified Nurse Midwife

## 2017-04-16 ENCOUNTER — Other Ambulatory Visit (HOSPITAL_COMMUNITY): Payer: Self-pay | Admitting: Maternal and Fetal Medicine

## 2017-04-16 ENCOUNTER — Encounter: Payer: Self-pay | Admitting: Obstetrics & Gynecology

## 2017-04-16 ENCOUNTER — Ambulatory Visit (INDEPENDENT_AMBULATORY_CARE_PROVIDER_SITE_OTHER): Payer: Medicaid Other | Admitting: Obstetrics & Gynecology

## 2017-04-16 ENCOUNTER — Other Ambulatory Visit (HOSPITAL_COMMUNITY): Payer: Self-pay | Admitting: Obstetrics and Gynecology

## 2017-04-16 VITALS — BP 115/71 | HR 91 | Wt 128.9 lb

## 2017-04-16 DIAGNOSIS — Z362 Encounter for other antenatal screening follow-up: Secondary | ICD-10-CM

## 2017-04-16 DIAGNOSIS — O365930 Maternal care for other known or suspected poor fetal growth, third trimester, not applicable or unspecified: Secondary | ICD-10-CM | POA: Insufficient documentation

## 2017-04-16 DIAGNOSIS — Z3A36 36 weeks gestation of pregnancy: Secondary | ICD-10-CM

## 2017-04-16 DIAGNOSIS — O359XX0 Maternal care for (suspected) fetal abnormality and damage, unspecified, not applicable or unspecified: Secondary | ICD-10-CM

## 2017-04-16 DIAGNOSIS — O0993 Supervision of high risk pregnancy, unspecified, third trimester: Secondary | ICD-10-CM | POA: Insufficient documentation

## 2017-04-16 DIAGNOSIS — O358XX0 Maternal care for other (suspected) fetal abnormality and damage, not applicable or unspecified: Secondary | ICD-10-CM | POA: Insufficient documentation

## 2017-04-16 DIAGNOSIS — O365931 Maternal care for other known or suspected poor fetal growth, third trimester, fetus 1: Secondary | ICD-10-CM

## 2017-04-16 DIAGNOSIS — Q614 Renal dysplasia: Secondary | ICD-10-CM

## 2017-04-16 DIAGNOSIS — O99333 Smoking (tobacco) complicating pregnancy, third trimester: Secondary | ICD-10-CM | POA: Diagnosis not present

## 2017-04-16 DIAGNOSIS — O09293 Supervision of pregnancy with other poor reproductive or obstetric history, third trimester: Secondary | ICD-10-CM | POA: Diagnosis present

## 2017-04-16 DIAGNOSIS — O0990 Supervision of high risk pregnancy, unspecified, unspecified trimester: Secondary | ICD-10-CM | POA: Diagnosis present

## 2017-04-16 NOTE — Progress Notes (Signed)
   PRENATAL VISIT NOTE  Subjective:  Nancy Michael is a 27 y.o. G3P0020 at 1740w3d being seen today for ongoing prenatal care.  She is currently monitored for the following issues for this high-risk pregnancy and has Supervision of high risk pregnancy, antepartum; Tobacco abuse; Poor fetal growth affecting management of mother in third trimester; and Fetal renal anomaly, single gestation on their problem list.  Patient reports no complaints.  Contractions: Irregular. Vag. Bleeding: None.  Movement: Present. Denies leaking of fluid.   The following portions of the patient's history were reviewed and updated as appropriate: allergies, current medications, past family history, past medical history, past social history, past surgical history and problem list. Problem list updated.  Objective:   Vitals:   04/16/17 1035  BP: 115/71  Pulse: 91  Weight: 128 lb 14.4 oz (58.5 kg)    Fetal Status: Fetal Heart Rate (bpm): 134   Movement: Present     General:  Alert, oriented and cooperative. Patient is in no acute distress.  Skin: Skin is warm and dry. No rash noted.   Cardiovascular: Normal heart rate noted  Respiratory: Normal respiratory effort, no problems with respiration noted  Abdomen: Soft, gravid, appropriate for gestational age.  Pain/Pressure: Present     Pelvic: Cervical exam performed        Extremities: Normal range of motion.  Edema: None  Mental Status: Normal mood and affect. Normal behavior. Normal judgment and thought content.   Assessment and Plan:  Pregnancy: G3P0020 at 5840w3d  1. Supervision of high risk pregnancy, antepartum Routine 36 weeks - Strep Gp B NAA - GC[facility]Chlamydia probe amp [facility]  Preterm labor symptoms and general obstetric precautions including but not limited to vaginal bleeding, contractions, leaking of fluid and fetal movement were reviewed in detail with the patient. Please refer to After Visit Summary for other counseling  recommendations.  Return in about 1 month (around 05/14/2017) for postpartum.  Future Appointments  Date Time Provider Department Center  04/23/2017  8:45 AM [facility] US 2 [facility]  04/30/2017 10:30 AM [facility] US 1 [facility]   Patient Active Problem List   Diagnosis Date Noted  . Fetal renal anomaly, single gestation 03/26/2017  . Poor fetal growth affecting management of mother in third trimester 03/19/2017  . Tobacco abuse 11/18/2016  . Supervision of high risk pregnancy, antepartum 10/21/2016  IUGR, Recommendations: Interval growth has been inadequate. Despite reassuring BPP  and dopplers, recommend delivery as soon after 37 weeks as  is practical  Will need evaluation of right kidney postdelivery  Scheryl DarterJames Juwan Vences, MD

## 2017-04-16 NOTE — Telephone Encounter (Signed)
Preadmission screen  

## 2017-04-16 NOTE — Patient Instructions (Signed)
Labor Induction Labor induction is when steps are taken to cause a pregnant woman to begin the labor process. Most women go into labor on their own between 37 weeks and 42 weeks of the pregnancy. When this does not happen or when there is a medical need, methods may be used to induce labor. Labor induction causes a pregnant woman's uterus to contract. It also causes the cervix to soften (ripen), open (dilate), and thin out (efface). Usually, labor is not induced before 39 weeks of the pregnancy unless there is a problem with the baby or mother. Before inducing labor, your health care provider will consider a number of factors, including the following:  The medical condition of you and the baby.  How many weeks along you are.  The status of the baby's lung maturity.  The condition of the cervix.  The position of the baby. What are the reasons for labor induction? Labor may be induced for the following reasons:  The health of the baby or mother is at risk.  The pregnancy is overdue by 1 week or more.  The water breaks but labor does not start on its own.  The mother has a health condition or serious illness, such as high blood pressure, infection, placental abruption, or diabetes.  The amniotic fluid amounts are low around the baby.  The baby is distressed. Convenience or wanting the baby to be born on a certain date is not a reason for inducing labor. What methods are used for labor induction? Several methods of labor induction may be used, such as:  Prostaglandin medicine. This medicine causes the cervix to dilate and ripen. The medicine will also start contractions. It can be taken by mouth or by inserting a suppository into the vagina.  Inserting a thin tube (catheter) with a balloon on the end into the vagina to dilate the cervix. Once inserted, the balloon is expanded with water, which causes the cervix to open.  Stripping the membranes. Your health care provider separates  amniotic sac tissue from the cervix, causing the cervix to be stretched and causing the release of a hormone called progesterone. This may cause the uterus to contract. It is often done during an office visit. You will be sent home to wait for the contractions to begin. You will then come in for an induction.  Breaking the water. Your health care provider makes a hole in the amniotic sac using a small instrument. Once the amniotic sac breaks, contractions should begin. This may still take hours to see an effect.  Medicine to trigger or strengthen contractions. This medicine is given through an IV access tube inserted into a vein in your arm. All of the methods of induction, besides stripping the membranes, will be done in the hospital. Induction is done in the hospital so that you and the baby can be carefully monitored. How long does it take for labor to be induced? Some inductions can take up to 2-3 days. Depending on the cervix, it usually takes less time. It takes longer when you are induced early in the pregnancy or if this is your first pregnancy. If a mother is still pregnant and the induction has been going on for 2-3 days, either the mother will be sent home or a cesarean delivery will be needed. What are the risks associated with labor induction? Some of the risks of induction include:  Changes in fetal heart rate, such as too high, too low, or erratic.  Fetal distress.    Chance of infection for the mother and baby.  Increased chance of having a cesarean delivery.  Breaking off (abruption) of the placenta from the uterus (rare).  Uterine rupture (very rare). When induction is needed for medical reasons, the benefits of induction may outweigh the risks. What are some reasons for not inducing labor? Labor induction should not be done if:  It is shown that your baby does not tolerate labor.  You have had previous surgeries on your uterus, such as a myomectomy or the removal of  fibroids.  Your placenta lies very low in the uterus and blocks the opening of the cervix (placenta previa).  Your baby is not in a head-down position.  The umbilical cord drops down into the birth canal in front of the baby. This could cut off the baby's blood and oxygen supply.  You have had a previous cesarean delivery.  There are unusual circumstances, such as the baby being extremely premature. This information is not intended to replace advice given to you by your health care provider. Make sure you discuss any questions you have with your health care provider. Document Released: 05/22/2006 Document Revised: 06/08/2015 Document Reviewed: 07/30/2012 Elsevier Interactive Patient Education  2017 [employer]  

## 2017-04-16 NOTE — Progress Notes (Signed)
Pt had today BPP = 8/8, Dopplers , and F/U done today.  GBS due today.

## 2017-04-18 LAB — GC/CHLAMYDIA PROBE AMP (~~LOC~~) NOT AT ARMC
CHLAMYDIA, DNA PROBE: NEGATIVE
NEISSERIA GONORRHEA: NEGATIVE

## 2017-04-18 LAB — STREP GP B NAA: STREP GROUP B AG: NEGATIVE

## 2017-04-20 ENCOUNTER — Inpatient Hospital Stay (HOSPITAL_COMMUNITY)
Admission: RE | Admit: 2017-04-20 | Discharge: 2017-04-22 | DRG: 807 | Disposition: A | Discharge status: Home / Self Care | Payer: Medicaid Other | Source: Ambulatory Visit | Attending: Obstetrics & Gynecology | Admitting: Obstetrics & Gynecology

## 2017-04-20 ENCOUNTER — Inpatient Hospital Stay (HOSPITAL_COMMUNITY): Payer: Medicaid Other | Admitting: Anesthesiology

## 2017-04-20 ENCOUNTER — Encounter (HOSPITAL_COMMUNITY): Payer: Self-pay

## 2017-04-20 DIAGNOSIS — Z3A37 37 weeks gestation of pregnancy: Secondary | ICD-10-CM

## 2017-04-20 DIAGNOSIS — O365930 Maternal care for other known or suspected poor fetal growth, third trimester, not applicable or unspecified: Secondary | ICD-10-CM | POA: Diagnosis present

## 2017-04-20 DIAGNOSIS — O358XX0 Maternal care for other (suspected) fetal abnormality and damage, not applicable or unspecified: Secondary | ICD-10-CM | POA: Diagnosis present

## 2017-04-20 DIAGNOSIS — Z87891 Personal history of nicotine dependence: Secondary | ICD-10-CM | POA: Diagnosis not present

## 2017-04-20 DIAGNOSIS — O365990 Maternal care for other known or suspected poor fetal growth, unspecified trimester, not applicable or unspecified: Secondary | ICD-10-CM | POA: Diagnosis present

## 2017-04-20 DIAGNOSIS — O0990 Supervision of high risk pregnancy, unspecified, unspecified trimester: Secondary | ICD-10-CM

## 2017-04-20 DIAGNOSIS — O35EXX0 Maternal care for other (suspected) fetal abnormality and damage, fetal genitourinary anomalies, not applicable or unspecified: Secondary | ICD-10-CM

## 2017-04-20 DIAGNOSIS — Z720 Tobacco use: Secondary | ICD-10-CM | POA: Diagnosis present

## 2017-04-20 HISTORY — DX: Pneumonia, unspecified organism: J18.9

## 2017-04-20 HISTORY — DX: Maternal care for other known or suspected poor fetal growth, unspecified trimester, not applicable or unspecified: O36.5990

## 2017-04-20 LAB — TYPE AND SCREEN
ABO/RH(D): A POS
ANTIBODY SCREEN: NEGATIVE

## 2017-04-20 LAB — CBC
HCT: 38 % (ref 36.0–46.0)
HEMOGLOBIN: 12.9 g/dL (ref 12.0–15.0)
MCH: 32.3 pg (ref 26.0–34.0)
MCHC: 33.9 g/dL (ref 30.0–36.0)
MCV: 95.2 fL (ref 78.0–100.0)
Platelets: 333 10*3/uL (ref 150–400)
RBC: 3.99 MIL/uL (ref 3.87–5.11)
RDW: 12.8 % (ref 11.5–15.5)
WBC: 16.5 10*3/uL — AB (ref 4.0–10.5)

## 2017-04-20 LAB — RPR: RPR: NONREACTIVE

## 2017-04-20 MED ORDER — ZOLPIDEM TARTRATE 5 MG PO TABS
5.0000 mg | ORAL_TABLET | Freq: Every evening | ORAL | Status: DC | PRN
Start: 2017-04-20 — End: 2017-04-22

## 2017-04-20 MED ORDER — WITCH HAZEL-GLYCERIN EX PADS: 1.0000 "application " | MEDICATED_PAD | CUTANEOUS | Status: DC | PRN

## 2017-04-20 MED ORDER — FLEET ENEMA 7-19 GM/118ML RE ENEM: 1.0000 | ENEMA | RECTAL | Status: DC | PRN

## 2017-04-20 MED ORDER — PHENYLEPHRINE 40 MCG/ML (10ML) SYRINGE FOR IV PUSH (FOR BLOOD PRESSURE SUPPORT)
80.0000 ug | PREFILLED_SYRINGE | INTRAVENOUS | Status: DC | PRN
  Filled 2017-04-20: qty 5

## 2017-04-20 MED ORDER — HYDROXYZINE HCL 50 MG PO TABS
50.0000 mg | ORAL_TABLET | Freq: Four times a day (QID) | ORAL | Status: DC | PRN
  Filled 2017-04-20: qty 1

## 2017-04-20 MED ORDER — ACETAMINOPHEN 325 MG PO TABS: 650.0000 mg | ORAL_TABLET | ORAL | Status: DC | PRN

## 2017-04-20 MED ORDER — ONDANSETRON HCL 4 MG/2ML IJ SOLN: 4.0000 mg | INTRAMUSCULAR | Status: DC | PRN

## 2017-04-20 MED ORDER — ZOLPIDEM TARTRATE 5 MG PO TABS: 5.0000 mg | ORAL_TABLET | Freq: Every evening | ORAL | Status: DC | PRN

## 2017-04-20 MED ORDER — OXYCODONE-ACETAMINOPHEN 5-325 MG PO TABS: 1.0000 | ORAL_TABLET | ORAL | Status: DC | PRN

## 2017-04-20 MED ORDER — LIDOCAINE HCL (PF) 1 % IJ SOLN
INTRAMUSCULAR | Status: DC | PRN
  Administered 2017-04-20 (×2): 5 mL via EPIDURAL

## 2017-04-20 MED ORDER — MISOPROSTOL 25 MCG QUARTER TABLET
25.0000 ug | ORAL_TABLET | ORAL | Status: DC | PRN
  Administered 2017-04-20 (×2): 25 ug via VAGINAL
  Filled 2017-04-20 (×3): qty 1

## 2017-04-20 MED ORDER — TERBUTALINE SULFATE 1 MG/ML IJ SOLN
0.2500 mg | Freq: Once | INTRAMUSCULAR | Status: DC | PRN
  Filled 2017-04-20: qty 1

## 2017-04-20 MED ORDER — PRENATAL MULTIVITAMIN CH
1.0000 | ORAL_TABLET | Freq: Every day | ORAL | Status: DC
  Administered 2017-04-21 – 2017-04-22 (×2): 1 via ORAL
  Filled 2017-04-20 (×2): qty 1

## 2017-04-20 MED ORDER — COCONUT OIL OIL
1.0000 "application " | TOPICAL_OIL | Status: DC | PRN
  Administered 2017-04-21: 1 via TOPICAL
  Filled 2017-04-20: qty 120

## 2017-04-20 MED ORDER — BENZOCAINE-MENTHOL 20-0.5 % EX AERO
1.0000 "application " | INHALATION_SPRAY | CUTANEOUS | Status: DC | PRN
  Administered 2017-04-21: 1 via TOPICAL
  Filled 2017-04-20: qty 56

## 2017-04-20 MED ORDER — LIDOCAINE HCL (PF) 1 % IJ SOLN
30.0000 mL | INTRAMUSCULAR | Status: DC | PRN
  Filled 2017-04-20: qty 30

## 2017-04-20 MED ORDER — SENNOSIDES-DOCUSATE SODIUM 8.6-50 MG PO TABS
2.0000 | ORAL_TABLET | ORAL | Status: DC
  Administered 2017-04-21 (×2): 2 via ORAL
  Filled 2017-04-20 (×2): qty 2

## 2017-04-20 MED ORDER — ONDANSETRON HCL 4 MG PO TABS: 4.0000 mg | ORAL_TABLET | ORAL | Status: DC | PRN

## 2017-04-20 MED ORDER — SOD CITRATE-CITRIC ACID 500-334 MG/5ML PO SOLN: 30.0000 mL | ORAL | Status: DC | PRN

## 2017-04-20 MED ORDER — EPHEDRINE 5 MG/ML INJ
10.0000 mg | INTRAVENOUS | Status: DC | PRN
  Filled 2017-04-20: qty 2

## 2017-04-20 MED ORDER — DIPHENHYDRAMINE HCL 50 MG/ML IJ SOLN: 12.5000 mg | INTRAMUSCULAR | Status: DC | PRN

## 2017-04-20 MED ORDER — PHENYLEPHRINE 40 MCG/ML (10ML) SYRINGE FOR IV PUSH (FOR BLOOD PRESSURE SUPPORT)
80.0000 ug | PREFILLED_SYRINGE | INTRAVENOUS | Status: DC | PRN
  Filled 2017-04-20: qty 10
  Filled 2017-04-20: qty 5

## 2017-04-20 MED ORDER — EPHEDRINE 5 MG/ML INJ
10.0000 mg | INTRAVENOUS | Status: DC | PRN
  Filled 2017-04-20: qty 4
  Filled 2017-04-20: qty 2

## 2017-04-20 MED ORDER — OXYCODONE-ACETAMINOPHEN 5-325 MG PO TABS: 2.0000 | ORAL_TABLET | ORAL | Status: DC | PRN

## 2017-04-20 MED ORDER — DIBUCAINE 1 % RE OINT: 1.0000 "application " | TOPICAL_OINTMENT | RECTAL | Status: DC | PRN

## 2017-04-20 MED ORDER — TETANUS-DIPHTH-ACELL PERTUSSIS 5-2.5-18.5 LF-MCG/0.5 IM SUSP: 0.5000 mL | Freq: Once | INTRAMUSCULAR | Status: DC

## 2017-04-20 MED ORDER — LACTATED RINGERS IV SOLN: 500.0000 mL | INTRAVENOUS | Status: DC | PRN

## 2017-04-20 MED ORDER — ONDANSETRON HCL 4 MG/2ML IJ SOLN: 4.0000 mg | Freq: Four times a day (QID) | INTRAMUSCULAR | Status: DC | PRN

## 2017-04-20 MED ORDER — FENTANYL CITRATE (PF) 100 MCG/2ML IJ SOLN: 50.0000 ug | INTRAMUSCULAR | Status: DC | PRN

## 2017-04-20 MED ORDER — ACETAMINOPHEN 325 MG PO TABS
650.0000 mg | ORAL_TABLET | ORAL | Status: DC | PRN
  Administered 2017-04-21 – 2017-04-22 (×5): 650 mg via ORAL
  Filled 2017-04-20 (×5): qty 2

## 2017-04-20 MED ORDER — FENTANYL 2.5 MCG/ML BUPIVACAINE 1/10 % EPIDURAL INFUSION (WH - ANES)
14.0000 mL/h | INTRAMUSCULAR | Status: DC | PRN
  Administered 2017-04-20: 14 mL/h via EPIDURAL
  Filled 2017-04-20: qty 100

## 2017-04-20 MED ORDER — OXYTOCIN 40 UNITS IN LACTATED RINGERS INFUSION - SIMPLE MED
2.5000 [IU]/h | INTRAVENOUS | Status: DC
  Filled 2017-04-20: qty 1000

## 2017-04-20 MED ORDER — OXYTOCIN 40 UNITS IN LACTATED RINGERS INFUSION - SIMPLE MED
1.0000 m[IU]/min | INTRAVENOUS | Status: DC
  Administered 2017-04-20: 2 m[IU]/min via INTRAVENOUS

## 2017-04-20 MED ORDER — LACTATED RINGERS IV SOLN
INTRAVENOUS | Status: DC
  Administered 2017-04-20 (×2): via INTRAVENOUS

## 2017-04-20 MED ORDER — SIMETHICONE 80 MG PO CHEW: 80.0000 mg | CHEWABLE_TABLET | ORAL | Status: DC | PRN

## 2017-04-20 MED ORDER — LACTATED RINGERS IV SOLN: 500.0000 mL | Freq: Once | INTRAVENOUS | Status: DC

## 2017-04-20 MED ORDER — OXYTOCIN BOLUS FROM INFUSION: 500.0000 mL | Freq: Once | INTRAVENOUS | Status: DC

## 2017-04-20 NOTE — Progress Notes (Signed)
Subjective: Doing well. Discussed options with patient and she is now amenable to Foley bulb and pit.   Objective: BP 111/76   Pulse 80   Temp 98.1 F (36.7 C) (Oral)   Resp 18   Ht 5' 5.5" (1.664 m)   Wt 60.3 kg (133 lb)   LMP 07/28/2016 (Approximate)   SpO2 100%   BMI 21.80 kg/m  No intake/output data recorded. No intake/output data recorded.  FHT:  FHR: 140 bpm, variability: minimal ,  accelerations:  Absent,  decelerations:  Absent UC:   regular, every 3 minutes SVE:   Dilation: 1 Effacement (%): 80 Station: -2 Exam by:: Hart RochesterLawson, CNM  Labs: Lab Results  Component Value Date   WBC 16.5 (H) 04/20/2017   HGB 12.9 04/20/2017   HCT 38.0 04/20/2017   MCV 95.2 04/20/2017   PLT 333 04/20/2017    Assessment / Plan: Induction of labor due to IUGR,. Placed foley bulb and starting pitocin  Labor: augmenting with foley bulb and pitocin. s/p cytotec x2 Fetal Wellbeing:  Category I Pain Control:  Analgesia prn I/D:  n/a Anticipated MOD:  NSVD  Nancy Michael Nancy Michael 04/20/2017, 10:12 AM

## 2017-04-20 NOTE — Anesthesia Pain Management Evaluation Note (Signed)
  CRNA Pain Management Visit Note  Patient: Nancy Michael, 27 y.o., female  "Hello I am a member of the anesthesia team at [facility]. We have an anesthesia team available at all times to provide care throughout the hospital, including epidural management and anesthesia for C-section. I don't know your plan for the delivery whether it a natural birth, water birth, IV sedation, nitrous supplementation, doula or epidural, but we want to meet your pain goals."   1.Was your pain managed to your expectations on prior hospitalizations?   No prior hospitalizations  2.What is your expectation for pain management during this hospitalization?   Unsure of plan, open to epidural.  3.How can we help you reach that goal? Nursing measures a this time.  Record the patient's initial score and the patient's pain goal.   Pain: 2  Pain Goal: 7 [facility] wants you to be able to say your pain was always managed very well.  Tyshaun Vinzant 04/20/2017

## 2017-04-20 NOTE — Anesthesia Preprocedure Evaluation (Signed)
Anesthesia Evaluation  Patient identified by MRN, date of birth, ID band Patient awake    Reviewed: Allergy & Precautions, H&P , NPO status , Patient's Chart, lab work & pertinent test results  Airway Mallampati: II   Neck ROM: full    Dental   Pulmonary asthma , former smoker,    breath sounds clear to auscultation       Cardiovascular negative cardio ROS   Rhythm:regular Rate:Normal     Neuro/Psych    GI/Hepatic   Endo/Other    Renal/GU      Musculoskeletal   Abdominal   Peds  Hematology   Anesthesia Other Findings   Reproductive/Obstetrics (+) Pregnancy                             Anesthesia Physical Anesthesia Plan  ASA: II  Anesthesia Plan: Epidural   Post-op Pain Management:    Induction: Intravenous  PONV Risk Score and Plan: 2 and Treatment may vary due to age or medical condition  Airway Management Planned: Natural Airway  Additional Equipment:   Intra-op Plan:   Post-operative Plan:   Informed Consent: I have reviewed the patients History and Physical, chart, labs and discussed the procedure including the risks, benefits and alternatives for the proposed anesthesia with the patient or authorized representative who has indicated his/her understanding and acceptance.       Plan Discussed with: Anesthesiologist  Anesthesia Plan Comments:         Anesthesia Quick Evaluation  

## 2017-04-20 NOTE — Progress Notes (Signed)
Subjective: Complaining of pain. Decided to get epidural, epidural placed while in room   Objective: BP (!) 106/59   Pulse 93   Temp 98.1 F (36.7 C) (Oral)   Resp 20   Ht 5' 5.5" (1.664 m)   Wt 60.3 kg (133 lb)   LMP 07/28/2016 (Approximate)   SpO2 100%   BMI 21.80 kg/m  No intake/output data recorded. No intake/output data recorded.  FHT:  Tracings unable to be read as patient has been unable to keep device on UC:   Regular, q 2 minutes SVE:   Dilation: 4 Effacement (%): 80 Station: -1 Exam by:: Camelia EngK. Haynes, RN  Labs: Lab Results  Component Value Date   WBC 16.5 (H) 04/20/2017   HGB 12.9 04/20/2017   HCT 38.0 04/20/2017   MCV 95.2 04/20/2017   PLT 333 04/20/2017    Assessment / Plan: Induction of labor due to IUGR  Labor: progressing on pit, foley bulb Preeclampsia:  N/A Fetal Wellbeing:  Category I Pain Control:  Epidural I/D:  n/a Anticipated MOD:  NSVD  Myrene BuddyJacob Maxyne Derocher 04/20/2017, 3:36 PM

## 2017-04-20 NOTE — H&P (Addendum)
LABOR AND DELIVERY ADMISSION HISTORY AND PHYSICAL NOTE  Nancy Michael is a 27 y.o. female G3P0020 with IUP at [redacted]w[redacted]d by U/S presenting for IOL d/t MCDK of right kidney and IUGR (<10%ile). She reports positive fetal movement. She denies leakage of fluid or vaginal bleeding.  Prenatal History/Complications: PNC at [place] Pregnancy complications:  - IUGR (<10%ile) - MCDK of right kidney - Tobacco use disorder  Past Medical History: Past Medical History:  Diagnosis Date  . Asthma    as a child  . Bronchitis   . Seasonal allergies     Past Surgical History: Past Surgical History:  Procedure Laterality Date  . NO PAST SURGERIES      Obstetrical History: OB History    Gravida  3   Para      Term      Preterm      AB  2   Living  0     SAB  1   TAB      Ectopic  1   Multiple      Live Births  0           Social History: Social History   Socioeconomic History  . Marital status: Single    Spouse name: Not on file  . Number of children: Not on file  . Years of education: Not on file  . Highest education level: Not on file  Occupational History  . Not on file  Social Needs  . Financial resource strain: Not on file  . Food insecurity:    Worry: Not on file    Inability: Not on file  . Transportation needs:    Medical: Not on file    Non-medical: Not on file  Tobacco Use  . Smoking status: Former Smoker    Packs/day: 0.25    Types: Cigarettes    Last attempt to quit: 09/25/2011    Years since quitting: 5.5  . Smokeless tobacco: Never Used  Substance and Sexual Activity  . Alcohol use: Yes    Alcohol/week: 0.6 oz    Types: 1 Cans of beer per week    Comment: not currently  . Drug use: No    Types: Marijuana    Comment: not since preg  . Sexual activity: Yes  Lifestyle  . Physical activity:    Days per week: Not on file    Minutes per session: Not on file  . Stress: Not on file  Relationships  . Social connections:    Talks on phone: Not  on file    Gets together: Not on file    Attends religious service: Not on file    Active member of club or organization: Not on file    Attends meetings of clubs or organizations: Not on file    Relationship status: Not on file  Other Topics Concern  . Not on file  Social History Narrative  . Not on file    Family History: Family History  Problem Relation Age of Onset  . Diabetes Mother   . Hypertension Mother   . Cancer Maternal Grandmother        Lung  . Diabetes Maternal Grandmother   . Cancer Maternal Grandfather        Lung  . Diabetes Maternal Grandfather   . Other Neg Hx     Allergies: Allergies  Allergen Reactions  . Benadryl [Diphenhydramine Hcl] Swelling  . Ibuprofen Swelling    Medications Prior to Admission  Medication Sig  Dispense Refill Last Dose  . loratadine (CLARITIN) 10 MG tablet Take 10 mg by mouth daily.   Taking  . Prenat-FeAsp-Meth-FA-DHA w/o A (PRENATE PIXIE) 10-0.6-0.4-200 MG CAPS Take 1 tablet by mouth daily. 30 capsule 12 Taking     Review of Systems  All systems reviewed and negative except as stated in HPI  Physical Exam Last menstrual period 07/28/2016, unknown if currently breastfeeding. General appearance: alert, oriented, NAD Lungs: normal respiratory effort Heart: regular rate Abdomen: soft, non-tender; gravid, FH appropriate for GA Extremities: No calf swelling or tenderness Presentation: cephalic Fetal monitoring: bpm 150, moderate variability, accelerations present, no decelerations Uterine activity: minimal activity    Prenatal labs: ABO, Rh: A/Positive/-- (10/08 1002) Antibody: Negative (10/08 1002) Rubella: 4.36 (10/08 1002) RPR: Non Reactive (01/30 1040)  HBsAg: Negative (10/08 1002)  HIV: Non Reactive (01/30 1040)  GC/Chlamydia: negative GBS: Negative (04/03 1130)  2-hr GTT: WNL, 72/116/40 Genetic screening:  negative Anatomy US: unilateral MCDK, IUGR (<10%ile)  Prenatal Transfer Tool  Maternal Diabetes:  No Genetic Screening: Normal Maternal Ultrasounds/Referrals: Abnormal:  Findings:   IUGR, Fetal Kidney Anomalies Fetal Ultrasounds or other Referrals:  None Maternal Substance Abuse:  No Significant Maternal Medications:  None Significant Maternal Lab Results: Lab values include: Group B Strep negative  No results found for this or any previous visit (from the past 24 hour(s)).  Patient Active Problem List   Diagnosis Date Noted  . IUGR (intrauterine growth restriction) affecting care of mother 04/20/2017  . Fetal renal anomaly, single gestation 03/26/2017  . Poor fetal growth affecting management of mother in third trimester 03/19/2017  . Tobacco abuse 11/18/2016  . Supervision of high risk pregnancy, antepartum 10/21/2016    Assessment: Nancy Michael is a 27 y.o. G3P0020 at 3375w0d here for IOL d/t IUGR. Will admit for induction and plan for SVD.  #Labor: stage 1 #Pain: IV pain meds PRN, epidural upon request #FWB: category 1 #ID:  negative #MOF: breast #MOC:OCP #Circ:  N/A  Wendee BeaversDavid J McMullen 04/20/2017, 12:57 AM    OB FELLOW HISTORY AND PHYSICAL ATTESTATION  I have seen and examined this patient; I agree with above documentation in the resident's note.   IOL for IUGR. Cytotec for cervical ripening. Pt did not want FB placed; will discuss again labor if needed FHT Cat I GBS negative  Frederik PearJulie P Karol Liendo, MD OB Fellow 04/20/2017, 4:49 AM

## 2017-04-20 NOTE — Anesthesia Procedure Notes (Signed)
Epidural Patient location during procedure: OB Start time: 04/20/2017 3:21 PM End time: 04/20/2017 3:30 PM  Staffing Anesthesiologist: Achille RichHodierne, Leilynn Pilat, MD Performed: anesthesiologist   Preanesthetic Checklist Completed: patient identified, site marked, pre-op evaluation, timeout performed, IV checked, risks and benefits discussed and monitors and equipment checked  Epidural Patient position: sitting Prep: DuraPrep Patient monitoring: heart rate, cardiac monitor, continuous pulse ox and blood pressure Approach: midline Location: L2-L3 Injection technique: LOR saline  Needle:  Needle type: Tuohy  Needle gauge: 17 G Needle length: 9 cm Needle insertion depth: 4 cm Catheter type: closed end flexible Catheter size: 19 Gauge Catheter at skin depth: 11 cm Test dose: negative and Other  Assessment Events: blood not aspirated, injection not painful, no injection resistance and negative IV test  Additional Notes Informed consent obtained prior to proceeding including risk of failure, 1% risk of PDPH, risk of minor discomfort and bruising.  Discussed rare but serious complications including epidural abscess, permanent nerve injury, epidural hematoma.  Discussed alternatives to epidural analgesia and patient desires to proceed.  Timeout performed pre-procedure verifying patient name, procedure, and platelet count.  Patient tolerated procedure well. Reason for block:procedure for pain

## 2017-04-21 NOTE — Progress Notes (Signed)
POSTPARTUM PROGRESS NOTE  Post Partum Day 1  Subjective:  Nancy Michael is a 27 y.o. G3P0020 s/p SVD at 3745w1d.  No acute events overnight.  Pt denies problems with ambulating, voiding or po intake.  She denies nausea or vomiting.  Pain is well controlled.  She has had flatus. She has had bowel movement.  Lochia Small. She reports lower back pain and describes pain as sore- rates pain 4/10, has taken Tylenol for pain with some relief. Reports she does not like taking medication.   Objective: Blood pressure 91/64, pulse 70, temperature 98.2 F (36.8 C), temperature source Oral, resp. rate 20, height 5' 5.5" (1.664 m), weight 133 lb (60.3 kg), last menstrual period 07/28/2016, SpO2 100 %, unknown if currently breastfeeding.  Physical Exam:  General: alert, cooperative and no distress Abdomen: soft, nontender Uterine Fundus: firm, appropriately tender, U/1 DVT Evaluation: No calf swelling or tenderness Extremities: No edema Skin: warm, dry   Recent Labs    04/20/17 0109  HGB 12.9  HCT 38.0    Assessment/Plan: Nancy Michael is a 27 y.o. G3P0020 s/p SVD at 4945w1d   PPD#1 - Doing well Contraception: POPs Feeding: Breast feeding- lactation consultation this morning  Dispo: Plan for discharge tomorrow. Back pain: will try heating packs this morning along with Tylenol    LOS: 1 day   Sharyon CableRogers, Laiklyn Pilkenton C, CNM 04/21/2017, 10:28 AM

## 2017-04-21 NOTE — Lactation Note (Addendum)
This note was copied from a baby's chart. Lactation Consultation Note Baby 12 hrs old. Hasn't BF well. Mom has flat nipples, compressible, breast are bouncy. Hand expression unable to obtain colostrum. Moms breast are tender. Shells given and hand pump to pre-pump prior to wearing NS #20 given By RN. Mom has DEBP at bedside. Encouraged to use d/t baby LPI and under 5 lbs. Mom pumps while LC is attempting to feed baby. No colostrum obtained.  Baby wt. 4.6lbs baby will not latch on breast, not actively attempt to feed. W/gloved finger wouldn't suckle on finger. Attempted to feed Similac 22 cal formula w/bottle, suck training not helpful.  Baby sticking tongue long out past mouth. Tongue thrust. Spoon fed 5 ml which took a while d/t baby not actively sucking or has suck swallow coordination. Baby would smack lips together and tongue thrust. Also had 2 spit ups of formula and mucous.  Baby gags easily. Baby mainly held formula in mouth and would smack mouth then tongue thrust. LC doesn't feel baby really had 5 ml's d/t spit up and a little running from corners of mouth. Baby wouldn't obtain seal on nipple. Had NO interest in feeding on bottle.  Encouraged mom to do STS as much as possible.  Stressed importance of strict I&O!  LPI information sheet given and reviewed. Supplementation discussed. Mom states understanding. Reviewed milk storage, hand expression and pumping.  Baby's abd. Slightly distended. Discussed baby being spity and why. Stressed importance of documenting spits as well, and letting RN know if baby isn't feeding.  Discussed w/parents and RN of possible need to be transfer to NICU if closer to 24 hrs old and not feeding. Will be MD decision.   Baby wt. 4.4lbs. LC concerned about baby's blood sugar dropping while not BF.  Encouraged parents to attempt to BF, or supplement every 2 to 2 1/2 hrs. Not wait until 3 hrs unless MD orders. Encouraged to notify RN if baby isn't feeding.    Encouraged to pump every 3 hrs. Coconut oil given for soreness. Mom pumped while in rm. Not anything collected.  Reported to RN of concern of small baby not feeding. It's normal for newborns not to eat a lot before 24 hrs, but this small baby is to small and can't afford to loose wt.  Voiced concerns to parents and RN if baby didn't start feeding may need to be tube fed. Patient Name: Nancy Michael WUJWJ'XToday's Date: 04/21/2017 Reason for consult: Initial assessment;Early term 37-38.6wks;Infant < 6lbs   Maternal Data Has patient been taught Hand Expression?: Yes Does the patient have breastfeeding experience prior to this delivery?: No  Feeding Feeding Type: Formula Nipple Type: Slow - flow Length of feed: 0 min  LATCH Score Latch: Too sleepy or reluctant, no latch achieved, no sucking elicited.  Audible Swallowing: None  Type of Nipple: Flat  Comfort (Breast/Nipple): Filling, red/small blisters or bruises, mild/mod discomfort(tender filling breast)        Interventions Interventions: Breast feeding basics reviewed;Breast massage;Hand express;Pre-pump if needed;Breast compression;DEBP;Hand pump;Shells;Coconut oil  Lactation Tools Discussed/Used Tools: Shells;Pump Nipple shield size: 20 Shell Type: Inverted Breast pump type: Double-Electric Breast Pump;Manual Pump Review: Setup, frequency, and cleaning;Milk Storage Initiated by:: RN Date initiated:: 04/20/17   Consult Status Consult Status: Follow-up Date: 04/21/17 Follow-up type: In-patient    Charyl DancerCARVER, Joey Lierman G 04/21/2017, 5:56 AM

## 2017-04-21 NOTE — Anesthesia Postprocedure Evaluation (Signed)
Anesthesia Post Note  Patient: Linus SalmonsSharae S Feggins  Procedure(s) Performed: AN AD HOC LABOR EPIDURAL     Patient location during evaluation: [facility] Anesthesia Type: Epidural Level of consciousness: awake, awake and alert and oriented Pain management: pain level controlled (Cramping discomfort. Patient said tylenol helps. RN  notified patient wants another dose of tylenol.) Vital Signs Assessment: post-procedure vital signs reviewed and stable Respiratory status: spontaneous breathing, nonlabored ventilation and respiratory function stable Cardiovascular status: stable Postop Assessment: no headache, no backache, no apparent nausea or vomiting, adequate PO intake and patient able to bend at knees Anesthetic complications: no    Last Vitals:  Vitals:   04/20/17 2130 04/21/17 0130  BP: 119/68 97/74  Pulse: 93 91  Resp: 18 18  Temp: 36.7 C 36.4 C  SpO2:      Last Pain:  Vitals:   04/21/17 0809  TempSrc:   PainSc: 8    Pain Goal:                 Denard Tuminello

## 2017-04-22 LAB — BIRTH TISSUE RECOVERY COLLECTION (PLACENTA DONATION)

## 2017-04-22 NOTE — Discharge Summary (Signed)
OB Discharge Summary     Patient Name: Nancy Michael DOB: 1990/04/10 MRN: [id number]  Date of admission: 04/20/2017 Delivering MD: Wyvonnia Dusky D   Date of discharge: 04/22/2017  Admitting diagnosis: 37 wk induction  Intrauterine pregnancy: [redacted]w[redacted]d     Secondary diagnosis:  Principal Problem:   NSVD (normal spontaneous vaginal delivery) Active Problems:   Supervision of high risk pregnancy, antepartum   Tobacco abuse   Fetal renal anomaly, single gestation   IUGR (intrauterine growth restriction) affecting care of mother      Discharge diagnosis: Term Pregnancy Delivered                                                                                                Post partum procedures:none  Augmentation: AROM, Pitocin, Cytotec and Foley Balloon  Complications: None  Hospital course:  Induction of Labor With Vaginal Delivery   27 y.o. yo G3P0020 at [redacted]w[redacted]d was admitted to the hospital 04/20/2017 for induction of labor.  Indication for induction: IUGR.  Patient had an uncomplicated labor course as follows s/p IOL: Membrane Rupture Time/Date: 3:47 PM ,04/20/2017   Intrapartum Procedures: Episiotomy: None [1]                                         Lacerations:  None [1]  Patient had delivery of a Viable infant.  Information for the patient's newborn:  Nancy Michael [[id number]]      04/20/2017  Details of delivery can be found in separate delivery note.  Patient had a routine postpartum course. Patient is discharged home 04/22/17.  Physical exam  Vitals:   04/21/17 0130 04/21/17 1018 04/21/17 1700 04/22/17 0613  BP: 97/74 91/64 104/66 (!) 116/52  Pulse: 91 70 79 80  Resp: 18 20 16 18   Temp: 97.6 F (36.4 C) 98.2 F (36.8 C) 98.9 F (37.2 C) 98 F (36.7 C)  TempSrc: Oral Oral Oral   SpO2:   100% 100%  Weight:      Height:       General: alert, cooperative and no distress Lochia: appropriate Uterine Fundus: firm Incision: N/A DVT Evaluation: No evidence of DVT  seen on physical exam. Labs: Lab Results  Component Value Date   WBC 16.5 (H) 04/20/2017   HGB 12.9 04/20/2017   HCT 38.0 04/20/2017   MCV 95.2 04/20/2017   PLT 333 04/20/2017   No flowsheet data found.  Discharge instruction: per After Visit Summary and "Baby and Me Booklet".  After visit meds:  Allergies as of 04/22/2017      Reactions   Benadryl [diphenhydramine Hcl] Swelling   Ibuprofen Swelling      Medication List    TAKE these medications   PRENATE PIXIE 10-0.6-0.4-200 MG Caps Take 1 tablet by mouth daily.       Diet: routine diet  Activity: Advance as tolerated. Pelvic rest for 6 weeks.   Outpatient follow up: 4 weeks  Postpartum contraception: Progesterone only pills  Newborn Data: Live born  female  Birth Weight: 4 lb 6.2 oz (1990 g) APGAR: 9, 9  Newborn Delivery   Birth date/time:  04/20/2017 16:58:00 Delivery type:  Vaginal, Spontaneous     Baby Feeding: Breast Disposition:home with mother   04/22/2017 Frederik PearJulie P Jeffrey Graefe, MD

## 2017-04-22 NOTE — Discharge Instructions (Signed)
Postpartum Care After Vaginal Delivery °The period of time right after you deliver your newborn is called the postpartum period. °What kind of medical care will I receive? °· You may continue to receive fluids and medicines through an IV tube inserted into one of your veins. °· If an incision was made near your vagina (episiotomy) or if you had some vaginal tearing during delivery, cold compresses may be placed on your episiotomy or your tear. This helps to reduce pain and swelling. °· You may be given a squirt bottle to use when you go to the bathroom. You may use this until you are comfortable wiping as usual. To use the squirt bottle, follow these steps: °? Before you urinate, fill the squirt bottle with warm water. Do not use hot water. °? After you urinate, while you are sitting on the toilet, use the squirt bottle to rinse the area around your urethra and vaginal opening. This rinses away any urine and blood. °? You may do this instead of wiping. As you start healing, you may use the squirt bottle before wiping yourself. Make sure to wipe gently. °? Fill the squirt bottle with clean water every time you use the bathroom. °· You will be given sanitary pads to wear. °How can I expect to feel? °· You may not feel the need to urinate for several hours after delivery. °· You will have some soreness and pain in your abdomen and vagina. °· If you are breastfeeding, you may have uterine contractions every time you breastfeed for up to several weeks postpartum. Uterine contractions help your uterus return to its normal size. °· It is normal to have vaginal bleeding (lochia) after delivery. The amount and appearance of lochia is often similar to a menstrual period in the first week after delivery. It will gradually decrease over the next few weeks to a dry, yellow-brown discharge. For most women, lochia stops completely by 6-8 weeks after delivery. Vaginal bleeding can vary from woman to woman. °· Within the first few  days after delivery, you may have breast engorgement. This is when your breasts feel heavy, full, and uncomfortable. Your breasts may also throb and feel hard, tightly stretched, warm, and tender. After this occurs, you may have milk leaking from your breasts. Your health care provider can help you relieve discomfort due to breast engorgement. Breast engorgement should go away within a few days. °· You may feel more sad or worried than normal due to hormonal changes after delivery. These feelings should not last more than a few days. If these feelings do not go away after several days, speak with your health care provider. °How should I care for myself? °· Tell your health care provider if you have pain or discomfort. °· Drink enough water to keep your urine clear or pale yellow. °· Wash your hands thoroughly with soap and water for at least 20 seconds after changing your sanitary pads, after using the toilet, and before holding or feeding your baby. °· If you are not breastfeeding, avoid touching your breasts a lot. Doing this can make your breasts produce more milk. °· If you become weak or lightheaded, or you feel like you might faint, ask for help before: °? Getting out of bed. °? Showering. °· Change your sanitary pads frequently. Watch for any changes in your flow, such as a sudden increase in volume, a change in color, the passing of large blood clots. If you pass a blood clot from your vagina, save it   to show to your health care provider. Do not flush blood clots down the toilet without having your health care provider look at them. °· Make sure that all your vaccinations are up to date. This can help protect you and your baby from getting certain diseases. You may need to have immunizations done before you leave the hospital. °· If desired, talk with your health care provider about methods of family planning or birth control (contraception). °How can I start bonding with my baby? °Spending as much time as  possible with your baby is very important. During this time, you and your baby can get to know each other and develop a bond. Having your baby stay with you in your room (rooming in) can give you time to get to know your baby. Rooming in can also help you become comfortable caring for your baby. Breastfeeding can also help you bond with your baby. °How can I plan for returning home with my baby? °· Make sure that you have a car seat installed in your vehicle. °? Your car seat should be checked by a certified car seat installer to make sure that it is installed safely. °? Make sure that your baby fits into the car seat safely. °· Ask your health care provider any questions you have about caring for yourself or your baby. Make sure that you are able to contact your health care provider with any questions after leaving the hospital. °This information is not intended to replace advice given to you by your health care provider. Make sure you discuss any questions you have with your health care provider. °Document Released: 10/28/2006 Document Revised: 06/05/2015 Document Reviewed: 12/05/2014 °Elsevier Interactive Patient Education © 2018 [employer] ° °

## 2017-04-23 ENCOUNTER — Ambulatory Visit (HOSPITAL_COMMUNITY): Admission: RE | Admit: 2017-04-23 | Payer: Medicaid Other | Source: Ambulatory Visit

## 2017-04-23 ENCOUNTER — Ambulatory Visit: Payer: Self-pay

## 2017-04-23 NOTE — Lactation Note (Signed)
This note was copied from a baby's chart. Lactation Consultation Note Baby 6159 hrs old. Mom has putting bay to the breast, also giving supplement after BF. No documentation of BF, just the supplementing which isn't enough for baby's hours of age. Discussed w/how much the baby should be supplementing w/LPI sheet. Mom is using DEBP and giving colostrum well as formula. Encouraged mom to increase amount. Mom states understanding.  Patient Name: Girl Hermina BartersSharae Corey ZOXWR'UToday's Date: 04/23/2017 Reason for consult: Follow-up assessment;Early term 37-38.6wks;Infant < 6lbs   Maternal Data    Feeding    LATCH Score                   Interventions    Lactation Tools Discussed/Used     Consult Status Consult Status: Follow-up Date: 04/24/17 Follow-up type: In-patient    Govani Radloff, Diamond NickelLAURA G 04/23/2017, 4:16 AM

## 2017-04-23 NOTE — Lactation Note (Signed)
This note was copied from a baby's chart. Lactation Consultation Note  Patient Name: Nancy Michael RUEAV'WToday's Date: 04/23/2017 Reason for consult: Follow-up assessment   Baby 64 hours old.  Baby < 5  Lbs. Mother pumping approx 25 ml. She has increased volume she has been giving to baby 20-30 ml. Reminded her to give breastmilk before formula feeding. [facility]. She has manual pump. Mom encouraged to feed baby 8-12 times/24 hours and with feeding cues at least q 3 hours. Reviewed engorgement care and monitoring voids/stools.      Maternal Data    Feeding Feeding Type: Breast Milk with Formula added  LATCH Score                   Interventions    Lactation Tools Discussed/Used     Consult Status Consult Status: Complete    Hardie PulleyBerkelhammer, Delora Gravatt Boschen 04/23/2017, 9:12 AM

## 2017-04-24 ENCOUNTER — Ambulatory Visit: Payer: Self-pay

## 2017-04-24 NOTE — Lactation Note (Signed)
This note was copied from a baby's chart. Lactation Consultation Note  Patient Name: Nancy Michael WGNFA'OToday's Date: 04/24/2017  Pecola LeisureBaby is 1988 hours old and mom has an abundant milk supply.  Baby is exclusively receiving breast milk per bottle.  She is taking 25-30 mls every 2-3 hours.  Discussed increasing volume as baby tolerates.  Mom's breasts are very full this AM.  Instructed to pump until milk is dripping.  She was still using initiation phase.  Instructed to use standard setting.  Mom wants to do a formula package with [facility].  Discussed importance of pumping on schedule.  She will try the hand pump and notify [facility] if she changes her mind.  Baby latches at times but not sustaining a latch.  Outpatient services encouraged.   Maternal Data    Feeding Feeding Type: Bottle Fed - Breast Milk Nipple Type: Slow - flow  LATCH Score                   Interventions    Lactation Tools Discussed/Used     Consult Status      Huston FoleyMOULDEN, Margareth Kanner S 04/24/2017, 9:21 AM

## 2017-04-30 ENCOUNTER — Ambulatory Visit (HOSPITAL_COMMUNITY): Payer: Medicaid Other

## 2017-05-27 ENCOUNTER — Telehealth: Payer: Self-pay | Admitting: Obstetrics & Gynecology

## 2017-05-27 NOTE — Telephone Encounter (Signed)
Pt called to schedule post partum. Pt delivered SVD on 04/19/17. Pt was out of town until 5/14. Given appt with Dr Debroah Loop since he delivered infant first available 5/24. Confirmed with pt. Let pt know if it needs to be sooner.

## 2017-06-06 ENCOUNTER — Ambulatory Visit (INDEPENDENT_AMBULATORY_CARE_PROVIDER_SITE_OTHER): Payer: Medicaid Other | Admitting: Obstetrics & Gynecology

## 2017-06-06 ENCOUNTER — Encounter: Payer: Self-pay | Admitting: Obstetrics & Gynecology

## 2017-06-06 DIAGNOSIS — Z1389 Encounter for screening for other disorder: Secondary | ICD-10-CM

## 2017-06-06 MED ORDER — DROSPIRENONE-ETHINYL ESTRADIOL 3-0.02 MG PO TABS: 1.0000 | ORAL_TABLET | Freq: Every day | ORAL | 11 refills | Status: DC

## 2017-06-06 NOTE — Progress Notes (Signed)
Post Partum Exam  Nancy Michael is a 27 y.o. G23P0020 female who presents for a postpartum visit. She is 7 weeks postpartum following a spontaneous vaginal delivery. I have fully reviewed the prenatal and intrapartum course. The delivery was at 37 gestational weeks.  Anesthesia: epidural. Postpartum course has been unremarkeable. Baby's course has been normal. Baby is feeding by bottle - Similac Neosure. Bleeding no bleeding. Bowel function is normal. Bladder function is normal. Patient is not sexually active. Contraception method is none. Postpartum depression screening:neg  The following portions of the patient's history were reviewed and updated as appropriate: allergies, current medications, past family history, past medical history, past social history, past surgical history and problem list. Last pap smear done 10/21/2016 and was Normal  Review of Systems Pertinent items are noted in HPI.    Objective:  Last menstrual period 07/28/2016, unknown if currently breastfeeding.  General:  alert, cooperative and no distress   Breasts:     Lungs: normal effort  Heart:     Abdomen: soft, non-tender; bowel sounds normal; no masses,  no organomegaly   Vulva:  not evaluated  Vagina: not evaluated                    Assessment:    normal postpartum exam. Pap smear not done at today's visit.   Plan:   1. Contraception: OCP (estrogen/progesterone) 2. Yaz 3. Follow up as needed.   Adam Phenix, MD 06/06/2017

## 2017-06-06 NOTE — Patient Instructions (Signed)
Oral Contraception Use Oral contraceptive pills (OCPs) are medicines taken to prevent pregnancy. OCPs work by preventing the ovaries from releasing eggs. The hormones in OCPs also cause the cervical mucus to thicken, preventing the sperm from entering the uterus. The hormones also cause the uterine lining to become thin, not allowing a fertilized egg to attach to the inside of the uterus. OCPs are highly effective when taken exactly as prescribed. However, OCPs do not prevent sexually transmitted diseases (STDs). Safe sex practices, such as using condoms along with an OCP, can help prevent STDs. Before taking OCPs, you may have a physical exam and Pap test. Your health care provider may also order blood tests if necessary. Your health care provider will make sure you are a good candidate for oral contraception. Discuss with your health care provider the possible side effects of the OCP you may be prescribed. When starting an OCP, it can take 2 to 3 months for the body to adjust to the changes in hormone levels in your body. How to take oral contraceptive pills Your health care provider may advise you on how to start taking the first cycle of OCPs. Otherwise, you can:  Start on day 1 of your menstrual period. You will not need any backup contraceptive protection with this start time.  Start on the first Sunday after your menstrual period or the day you get your prescription. In these cases, you will need to use backup contraceptive protection for the first week.  Start the pill at any time of your cycle. If you take the pill within 5 days of the start of your period, you are protected against pregnancy right away. In this case, you will not need a backup form of birth control. If you start at any other time of your menstrual cycle, you will need to use another form of birth control for 7 days. If your OCP is the type called a minipill, it will protect you from pregnancy after taking it for 2 days (48  hours).  After you have started taking OCPs:  If you forget to take 1 pill, take it as soon as you remember. Take the next pill at the regular time.  If you miss 2 or more pills, call your health care provider because different pills have different instructions for missed doses. Use backup birth control until your next menstrual period starts.  If you use a 28-day pack that contains inactive pills and you miss 1 of the last 7 pills (pills with no hormones), it will not matter. Throw away the rest of the non-hormone pills and start a new pill pack.  No matter which day you start the OCP, you will always start a new pack on that same day of the week. Have an extra pack of OCPs and a backup contraceptive method available in case you miss some pills or lose your OCP pack. Follow these instructions at home:  Do not smoke.  Always use a condom to protect against STDs. OCPs do not protect against STDs.  Use a calendar to mark your menstrual period days.  Read the information and directions that came with your OCP. Talk to your health care provider if you have questions. Contact a health care provider if:  You develop nausea and vomiting.  You have abnormal vaginal discharge or bleeding.  You develop a rash.  You miss your menstrual period.  You are losing your hair.  You need treatment for mood swings or depression.  You   get dizzy when taking the OCP.  You develop acne from taking the OCP.  You become pregnant. Get help right away if:  You develop chest pain.  You develop shortness of breath.  You have an uncontrolled or severe headache.  You develop numbness or slurred speech.  You develop visual problems.  You develop pain, redness, and swelling in the legs. This information is not intended to replace advice given to you by your health care provider. Make sure you discuss any questions you have with your health care provider. Document Released: 12/20/2010 Document  Revised: 06/08/2015 Document Reviewed: 06/21/2012 Elsevier Interactive Patient Education  2017 [employer]  

## 2017-08-03 ENCOUNTER — Encounter (HOSPITAL_COMMUNITY): Payer: Self-pay

## 2017-10-20 IMAGING — US US OB TRANSVAGINAL
1 series · 13 of 28 positions shown · non-contrast
Comparison: 12/25/2011

CLINICAL DATA: Intermittent pelvic cramping for 1 day. Vaginal
bleeding for 3 days. Three weeks 0 days pregnant by last menstrual.

EXAM:
OBSTETRIC <14 WK US AND TRANSVAGINAL OB US
TECHNIQUE: Both transabdominal and transvaginal ultrasound examinations were
performed for complete evaluation of the gestation as well as the
maternal uterus, adnexal regions, and pelvic cul-de-sac.
Transvaginal technique was performed to assess early pregnancy.

[Series 1: us ob transvaginal · 0.15mm/px · 13 of 89 slices shown]
[im 4/89]
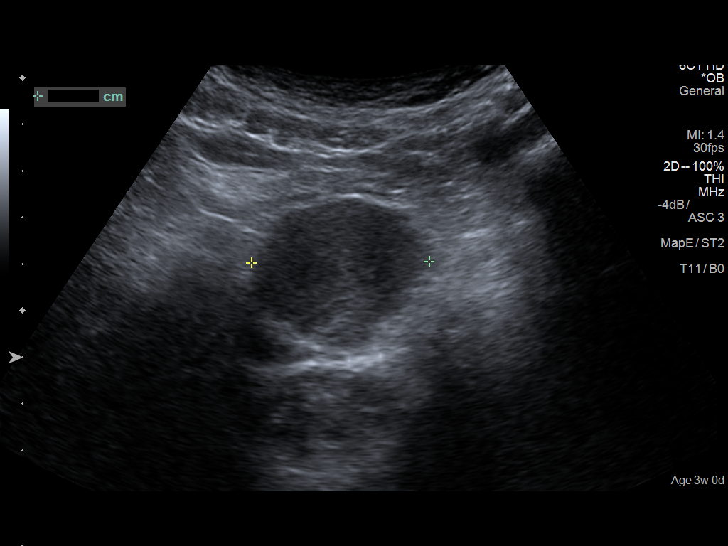
[im 10/89]
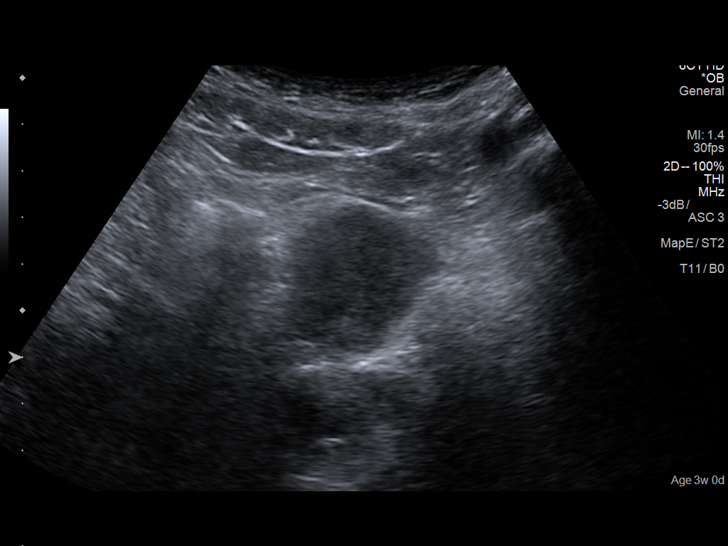
[im 17/89]
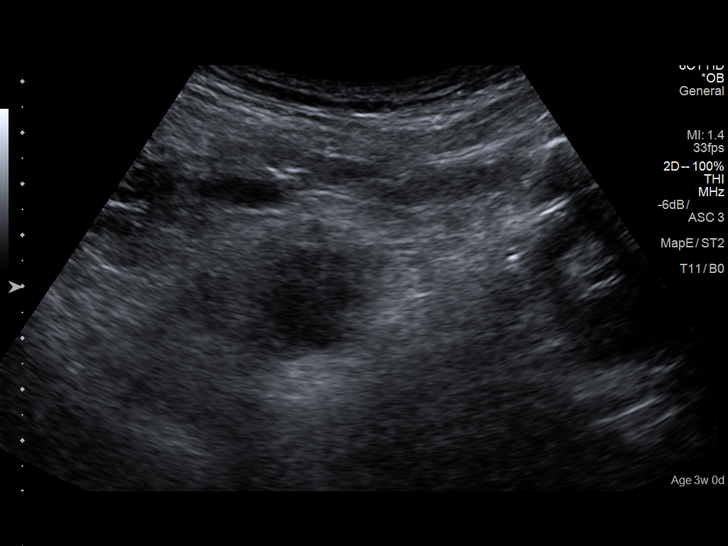
[im 23/89]
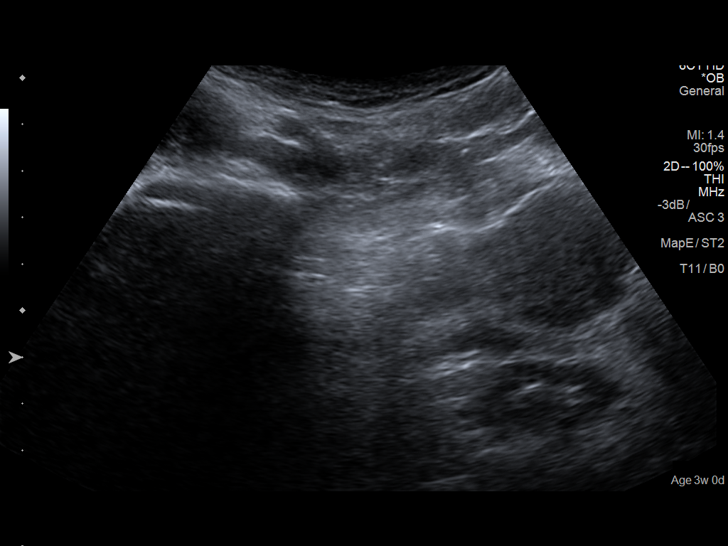
[im 30/89]
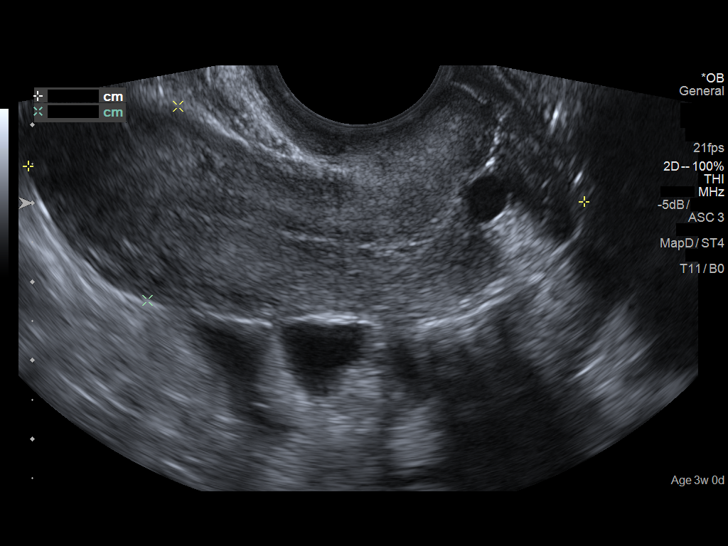
[im 36/89]
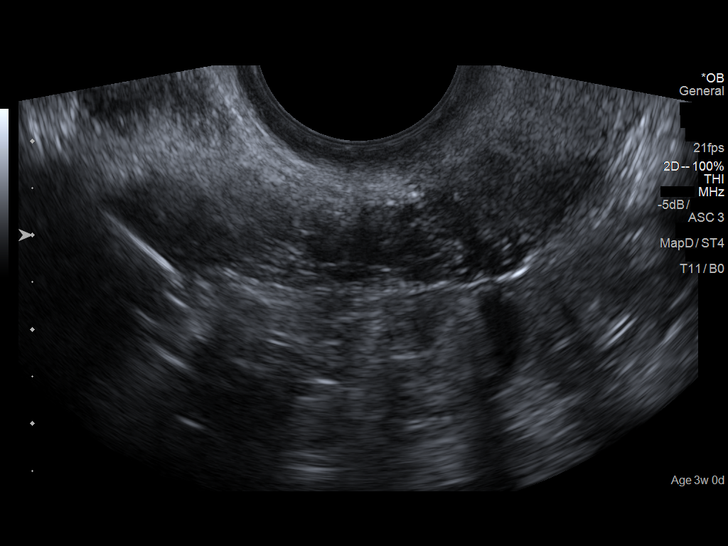
[im 46/89]
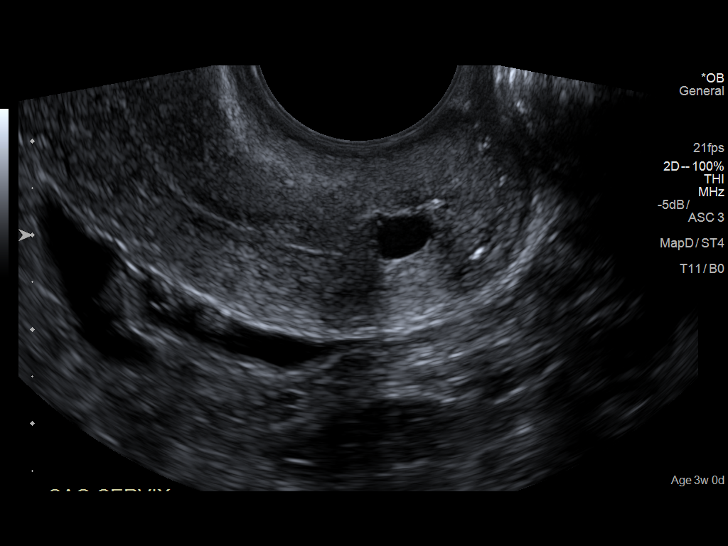
[im 53/89]
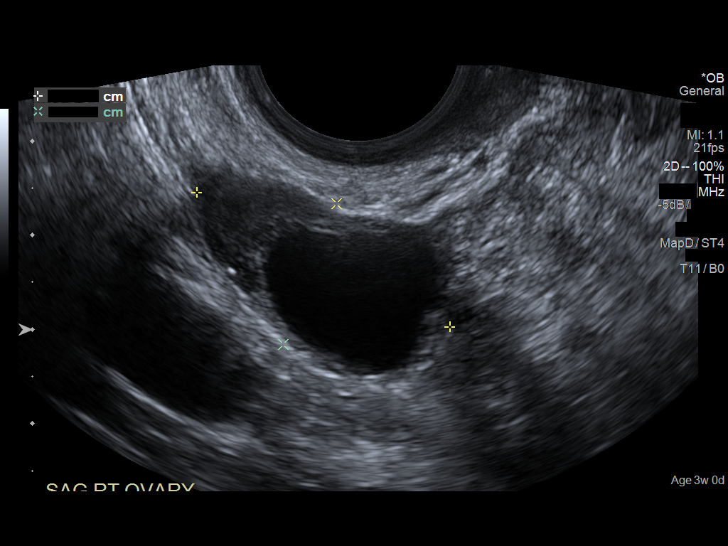
[im 59/89]
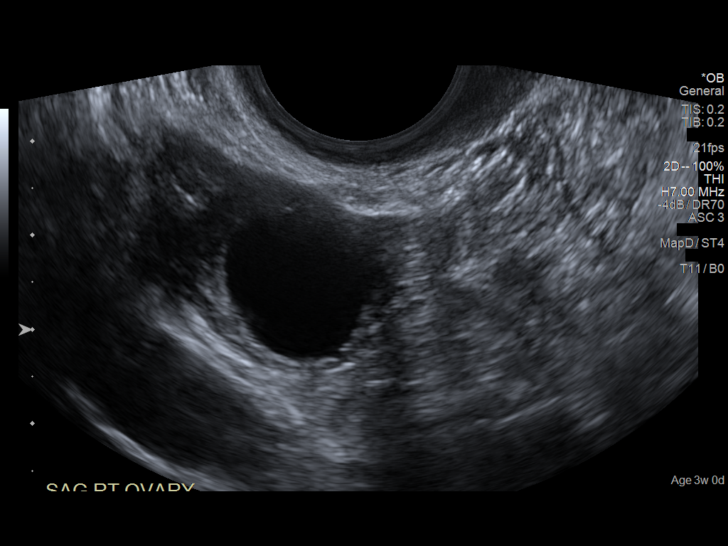
[im 66/89]
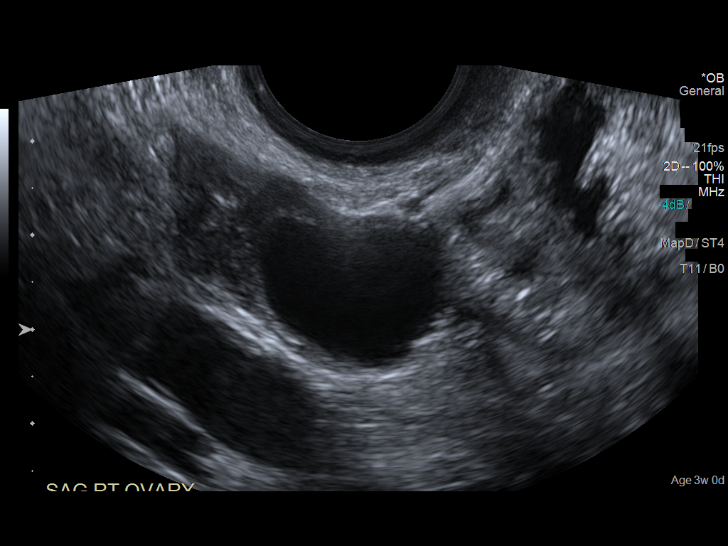
[im 72/89]
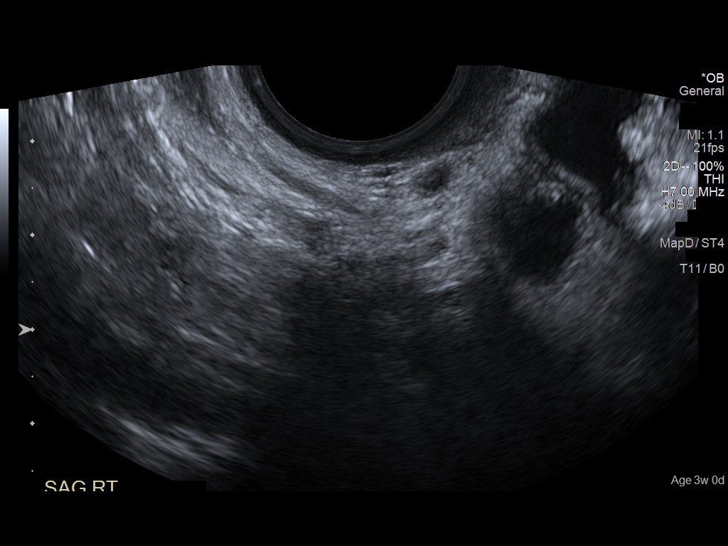
[im 79/89]
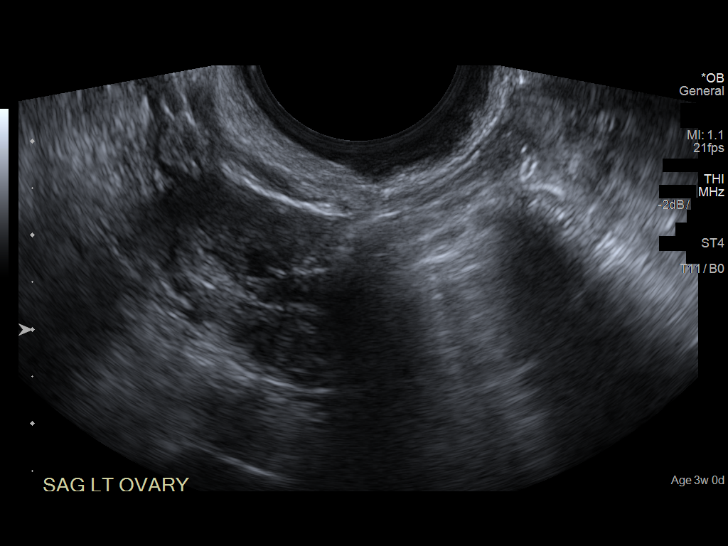
[im 85/89]
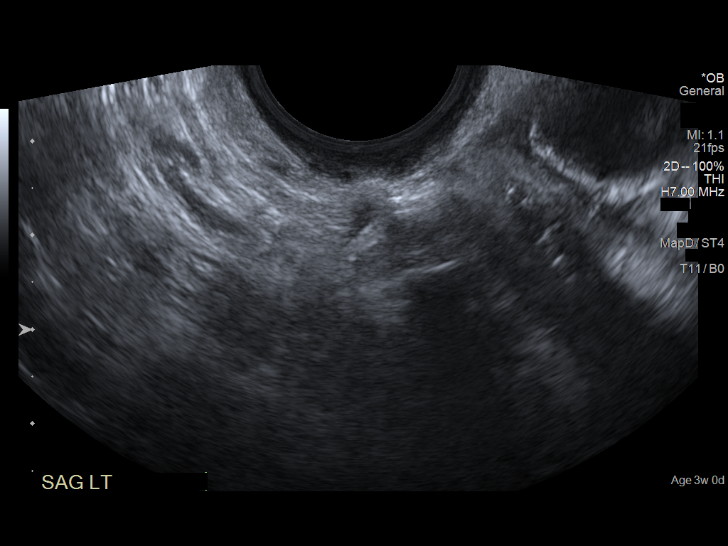

[13 of 28 positions shown; findings below may reference images not displayed]

FINDINGS: Intrauterine gestational sac: Absent

Yolk sac:  Absent

Embryo:  Absent

Subchorionic hemorrhage:  None visualized.

Maternal uterus/adnexae: No endometrial thickening or fluid
identified. Probable nabothian cysts in the cervix. A right ovarian
hypoechoic 1.8 cm lesion is most likely a corpus luteal cyst. Normal
left ovarian morphology. No significant free fluid.
IMPRESSION: 1. Lack of intrauterine gestational sac, yolk sac, or fetal pole.
Given early gestational age by last menstrual, this most likely
represents an early intrauterine pregnancy. Missed abortion or
otherwise occult ectopic pregnancy could look similar. Consider
short term follow-up with beta HCG level and possibly ultrasound.
2. Probable right ovarian corpus luteal cyst.

## 2019-02-08 ENCOUNTER — Ambulatory Visit: Payer: Medicaid Other | Attending: Internal Medicine

## 2019-02-08 DIAGNOSIS — Z20822 Contact with and (suspected) exposure to covid-19: Secondary | ICD-10-CM

## 2019-02-09 LAB — NOVEL CORONAVIRUS, NAA: SARS-CoV-2, NAA: NOT DETECTED

## 2019-03-01 ENCOUNTER — Ambulatory Visit (HOSPITAL_COMMUNITY)
Admission: EM | Admit: 2019-03-01 | Discharge: 2019-03-01 | Disposition: A | Discharge status: Home / Self Care | Payer: Medicaid Other | Attending: Family Medicine | Admitting: Family Medicine

## 2019-03-01 ENCOUNTER — Other Ambulatory Visit: Payer: Self-pay

## 2019-03-01 ENCOUNTER — Encounter (HOSPITAL_COMMUNITY): Payer: Self-pay

## 2019-03-01 DIAGNOSIS — Z8249 Family history of ischemic heart disease and other diseases of the circulatory system: Secondary | ICD-10-CM | POA: Diagnosis not present

## 2019-03-01 DIAGNOSIS — Z20822 Contact with and (suspected) exposure to covid-19: Secondary | ICD-10-CM | POA: Insufficient documentation

## 2019-03-01 DIAGNOSIS — Z833 Family history of diabetes mellitus: Secondary | ICD-10-CM | POA: Diagnosis not present

## 2019-03-01 DIAGNOSIS — K529 Noninfective gastroenteritis and colitis, unspecified: Secondary | ICD-10-CM | POA: Insufficient documentation

## 2019-03-01 DIAGNOSIS — Z886 Allergy status to analgesic agent status: Secondary | ICD-10-CM | POA: Insufficient documentation

## 2019-03-01 DIAGNOSIS — Z888 Allergy status to other drugs, medicaments and biological substances status: Secondary | ICD-10-CM | POA: Diagnosis not present

## 2019-03-01 DIAGNOSIS — Z3202 Encounter for pregnancy test, result negative: Secondary | ICD-10-CM

## 2019-03-01 DIAGNOSIS — Z87891 Personal history of nicotine dependence: Secondary | ICD-10-CM | POA: Diagnosis not present

## 2019-03-01 LAB — POCT URINALYSIS DIP (DEVICE)
Bilirubin Urine: NEGATIVE
Glucose, UA: NEGATIVE mg/dL
Hgb urine dipstick: NEGATIVE
Ketones, ur: NEGATIVE mg/dL
Leukocytes,Ua: NEGATIVE
Nitrite: NEGATIVE
Protein, ur: NEGATIVE mg/dL
Specific Gravity, Urine: >=1.03 (ref 1.005–1.030)
Urobilinogen, UA: 0.2 mg/dL (ref 0.0–1.0)
pH: 5.5 (ref 5.0–8.0)

## 2019-03-01 LAB — POCT PREGNANCY, URINE: Preg Test, Ur: NEGATIVE

## 2019-03-01 LAB — POC URINE PREG, ED: Preg Test, Ur: NEGATIVE

## 2019-03-01 MED ORDER — ONDANSETRON 4 MG PO TBDP: 4.0000 mg | ORAL_TABLET | Freq: Three times a day (TID) | ORAL | 0 refills | Status: DC | PRN

## 2019-03-01 NOTE — Discharge Instructions (Addendum)
Please do your best to ensure adequate fluid intake in order to avoid dehydration. If you find that you are unable to tolerate drinking fluids regularly please proceed to the Emergency Department for evaluation.  Also, you should return to the hospital if you experience persistent fevers, increasing abdominal pain, worsening diarrhea, dizziness, syncope (fainting), or for any other concerns you may find worrisome.   You have been tested for COVID-19 today. If your test returns positive, you will receive a phone call from [facility] regarding your results. Negative test results are not called. Both positive and negative results area always visible on MyChart. If you do not have a MyChart account, sign up instructions are provided in your discharge papers. Please do not hesitate to contact us should you have questions or concerns.

## 2019-03-01 NOTE — ED Triage Notes (Signed)
Pt states she has been vomiting x 2 days. Stomach hurts from vomiting.

## 2019-03-03 LAB — NOVEL CORONAVIRUS, NAA (HOSP ORDER, SEND-OUT TO REF LAB; TAT 18-24 HRS): SARS-CoV-2, NAA: NOT DETECTED

## 2019-03-03 NOTE — ED Provider Notes (Signed)
[facility]   [id number] 03/01/19 Arrival Time: 1009  ASSESSMENT & PLAN:  1. Gastroenteritis     COVID testing sent. UPT negative.   Discharge Instructions     Please do your best to ensure adequate fluid intake in order to avoid dehydration. If you find that you are unable to tolerate drinking fluids regularly please proceed to the Emergency Department for evaluation.  Also, you should return to the hospital if you experience persistent fevers, increasing abdominal pain, worsening diarrhea, dizziness, syncope (fainting), or for any other concerns you may find worrisome.   You have been tested for COVID-19 today. If your test returns positive, you will receive a phone call from [facility] regarding your results. Negative test results are not called. Both positive and negative results area always visible on MyChart. If you do not have a MyChart account, sign up instructions are provided in your discharge papers. Please do not hesitate to contact us should you have questions or concerns.       Meds ordered this encounter  Medications  . ondansetron (ZOFRAN-ODT) 4 MG disintegrating tablet    Sig: Take 1 tablet (4 mg total) by mouth every 8 (eight) hours as needed for nausea or vomiting.    Dispense:  15 tablet    Refill:  0    Discussed typical duration of symptoms for suspected viral GI illness.  Otherwise she will f/u with her PCP or here if not showing improvement over the next 48-72 hours.  Reviewed expectations re: course of current medical issues. Questions answered. Outlined signs and symptoms indicating need for more acute intervention. Patient verbalized understanding. After Visit Summary given.   SUBJECTIVE: History from: patient.  Nancy Michael is a 29 y.o. female who presents with complaint of non-bilious, non-bloody intermittent n/v with non-bloody loose stools. Onset approx 48 hours ago. Abdominal discomfort: mild and cramping. Symptoms are  stable since beginning. Aggravating factors: eating. Alleviating factors: none identified. Associated symptoms: mild fatigue. She denies arthralgias, chills, myalgias and sweats. Appetite: decreased. PO intake: decreased. Ambulatory without assistance. Urinary symptoms: none. Sick contacts: none. Recent travel or camping: none. OTC treatment: none.  No LMP recorded.  Past Surgical History:  Procedure Laterality Date  . NO PAST SURGERIES       OBJECTIVE:  Vitals:   03/01/19 1046 03/01/19 1047  BP:  119/81  Pulse:  95  Resp:  16  Temp:  98.2 F (36.8 C)  TempSrc:  Oral  SpO2:  100%  Weight: 56.7 kg     General appearance: alert; no distress Lungs: speaks full sentences without difficulty; unlabored Heart: regular Abdomen: soft Extremities: no edema Skin: warm; dry Neurologic: normal gait Psychological: alert and cooperative; normal mood and affect  Labs: Results for orders placed or performed during the hospital encounter of 03/01/19  POC urine pregnancy  Result Value Ref Range   Preg Test, Ur NEGATIVE NEGATIVE  POCT urinalysis dip (device)  Result Value Ref Range   Glucose, UA NEGATIVE NEGATIVE mg/dL   Bilirubin Urine NEGATIVE NEGATIVE   Ketones, ur NEGATIVE NEGATIVE mg/dL   Specific Gravity, Urine >=1.030 1.005 - 1.030   Hgb urine dipstick NEGATIVE NEGATIVE   pH 5.5 5.0 - 8.0   Protein, ur NEGATIVE NEGATIVE mg/dL   Urobilinogen, UA 0.2 0.0 - 1.0 mg/dL   Nitrite NEGATIVE NEGATIVE   Leukocytes,Ua NEGATIVE NEGATIVE  Pregnancy, urine POC  Result Value Ref Range   Preg Test, Ur NEGATIVE NEGATIVE   Labs Reviewed  NOVEL  CORONAVIRUS, NAA (HOSP ORDER, SEND-OUT TO REF LAB; TAT 18-24 HRS)  POC URINE PREG, ED  POCT URINALYSIS DIP (DEVICE)  POCT PREGNANCY, URINE      Allergies  Allergen Reactions  . Benadryl [Diphenhydramine Hcl] Swelling  . Ibuprofen Swelling                                               Past Medical History:  Diagnosis Date  . Asthma     as a child  . Bronchitis   . Pneumonia    childhood  . Seasonal allergies    Social History   Socioeconomic History  . Marital status: Single    Spouse name: Not on file  . Number of children: Not on file  . Years of education: Not on file  . Highest education level: Not on file  Occupational History  . Not on file  Tobacco Use  . Smoking status: Former Smoker    Packs/day: 0.25    Types: Cigarettes    Quit date: 08/14/2016    Years since quitting: 2.5  . Smokeless tobacco: Never Used  Substance and Sexual Activity  . Alcohol use: Yes    Alcohol/week: 1.0 standard drinks    Types: 1 Cans of beer per week    Comment: not currently  . Drug use: No    Types: Marijuana    Comment: not since preg  . Sexual activity: Yes    Birth control/protection: Pill  Other Topics Concern  . Not on file  Social History Narrative  . Not on file   Social Determinants of Health   Financial Resource Strain:   . Difficulty of Paying Living Expenses: Not on file  Food Insecurity:   . Worried About Charity fundraiser in the Last Year: Not on file  . Ran Out of Food in the Last Year: Not on file  Transportation Needs:   . Lack of Transportation (Medical): Not on file  . Lack of Transportation (Non-Medical): Not on file  Physical Activity:   . Days of Exercise per Week: Not on file  . Minutes of Exercise per Session: Not on file  Stress:   . Feeling of Stress : Not on file  Social Connections:   . Frequency of Communication with Friends and Family: Not on file  . Frequency of Social Gatherings with Friends and Family: Not on file  . Attends Religious Services: Not on file  . Active Member of Clubs or Organizations: Not on file  . Attends Archivist Meetings: Not on file  . Marital Status: Not on file  Intimate Partner Violence:   . Fear of Current or Ex-Partner: Not on file  . Emotionally Abused: Not on file  . Physically Abused: Not on file  . Sexually Abused: Not on  file   Family History  Problem Relation Age of Onset  . Diabetes Mother   . Hypertension Mother   . Cancer Maternal Grandmother        Lung  . Diabetes Maternal Grandmother   . Cancer Maternal Grandfather        Lung  . Diabetes Maternal Grandfather   . Other Neg Hx      Vanessa Kick, MD 03/03/19 [phone]

## 2019-07-29 IMAGING — US US OB LIMITED
1 series · 7 of 7 positions shown · non-contrast
Comparison: none

[Series 1: us ob limited · 0.20mm/px · 7 of 7 slices shown]
[im 1/7]
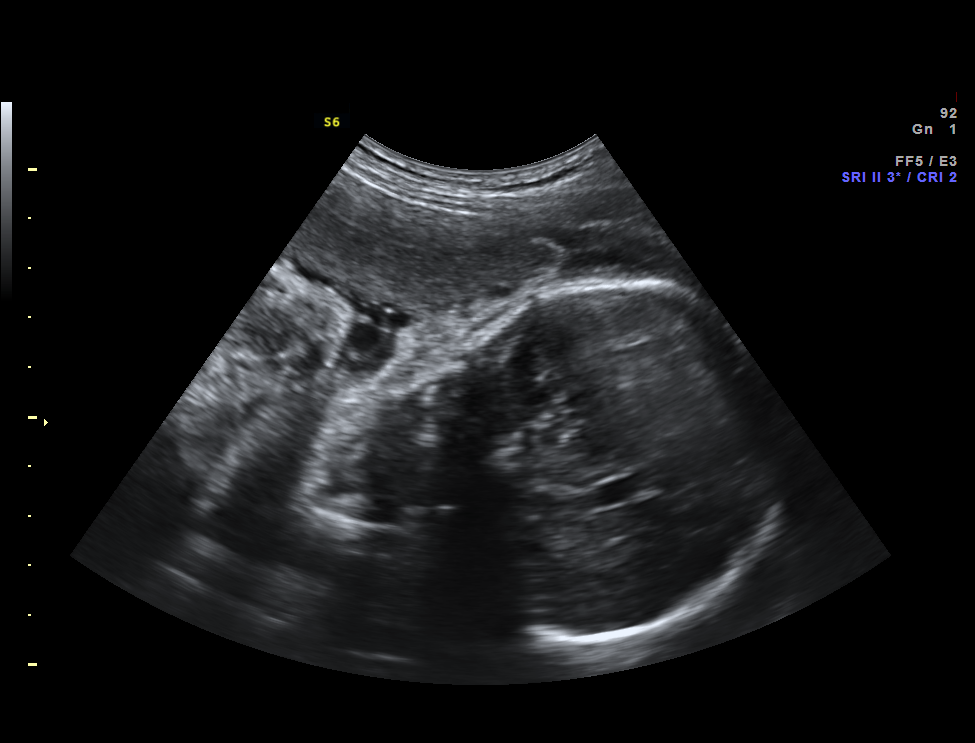
[im 2/7]
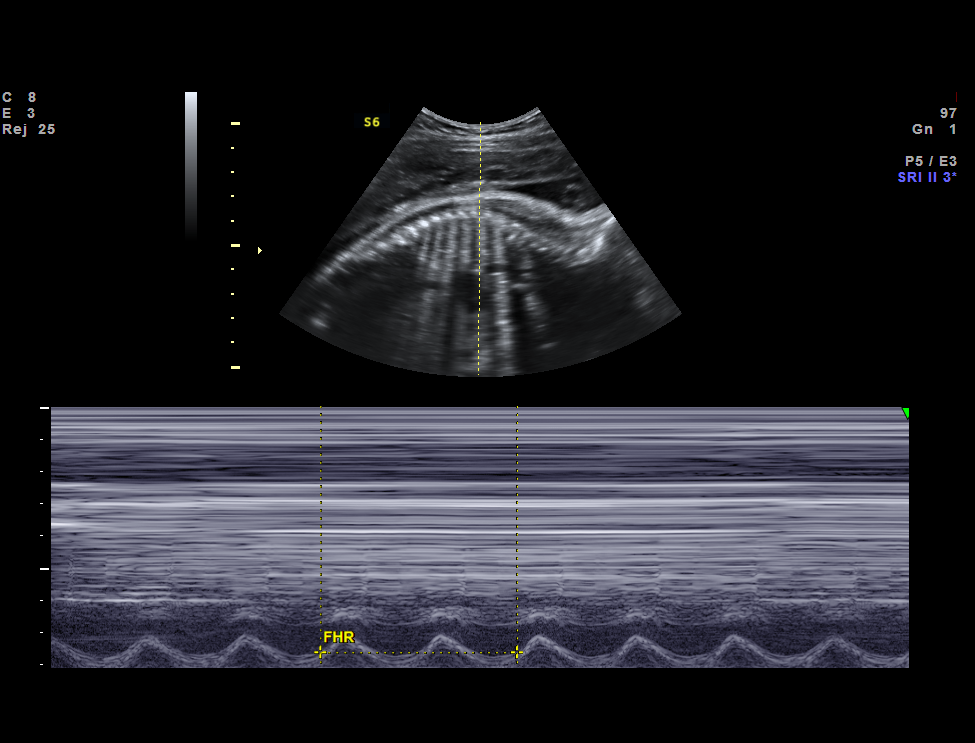
[im 3/7]
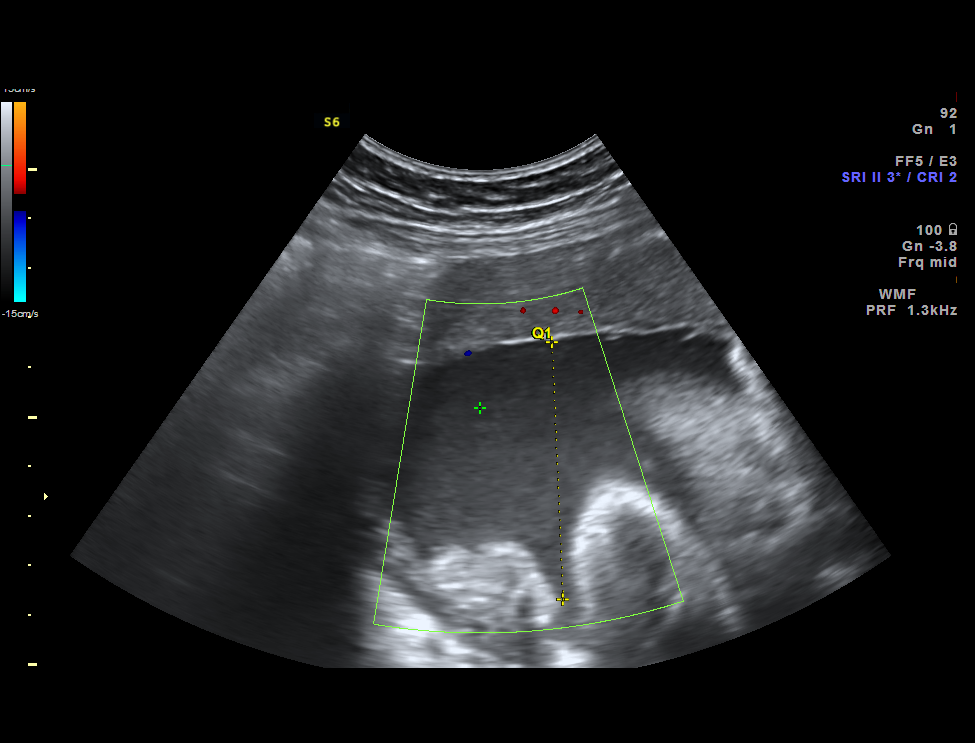
[im 4/7]
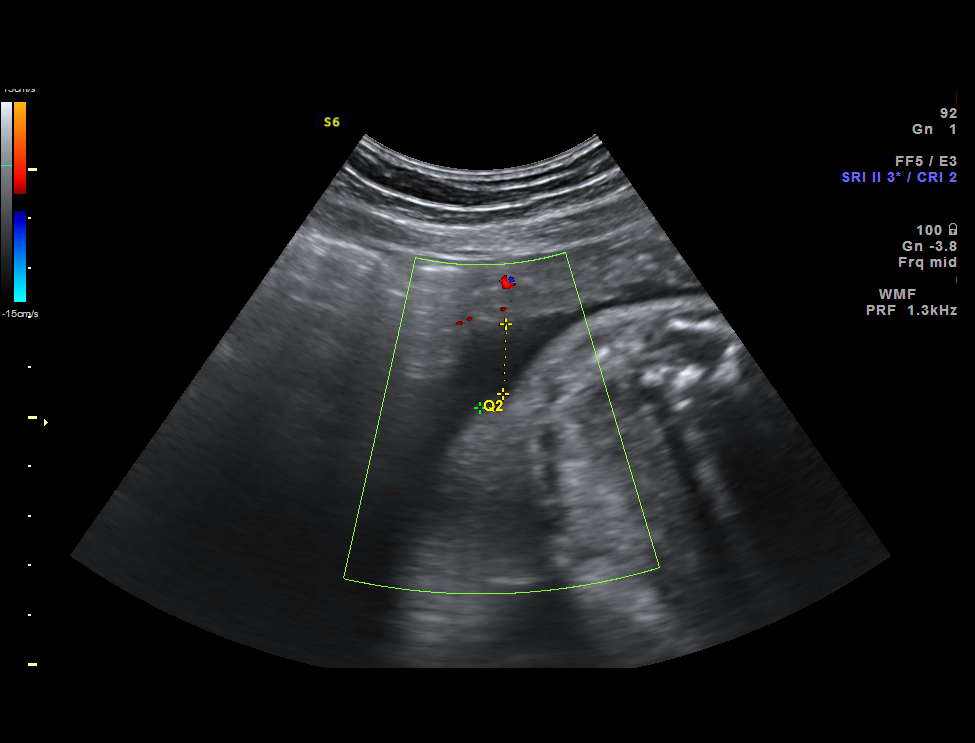
[im 5/7]
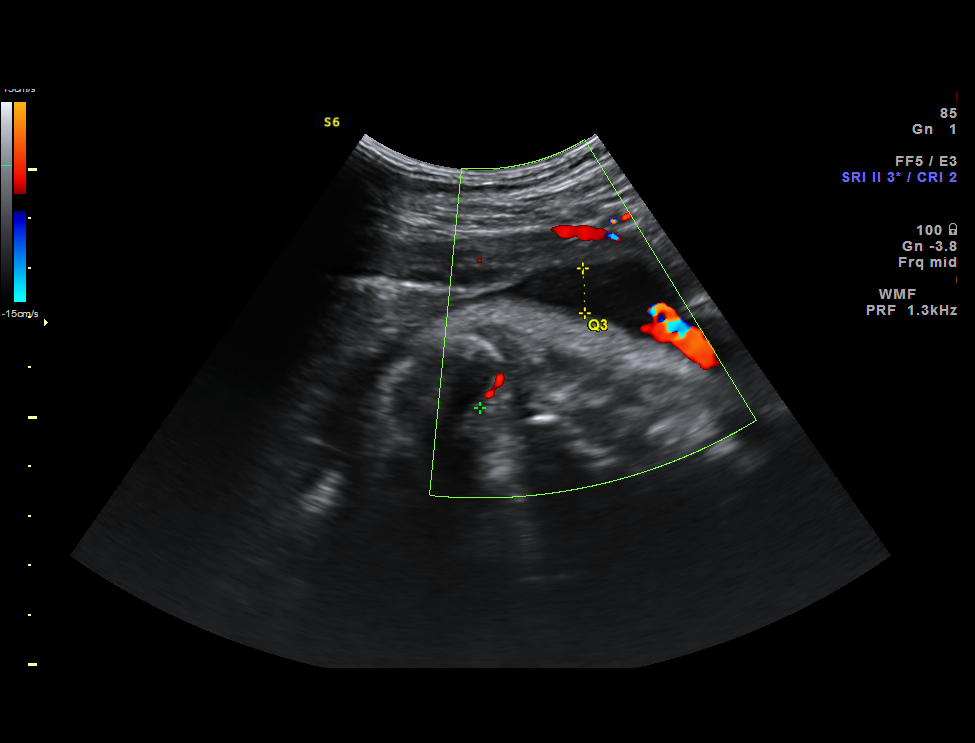
[im 6/7]
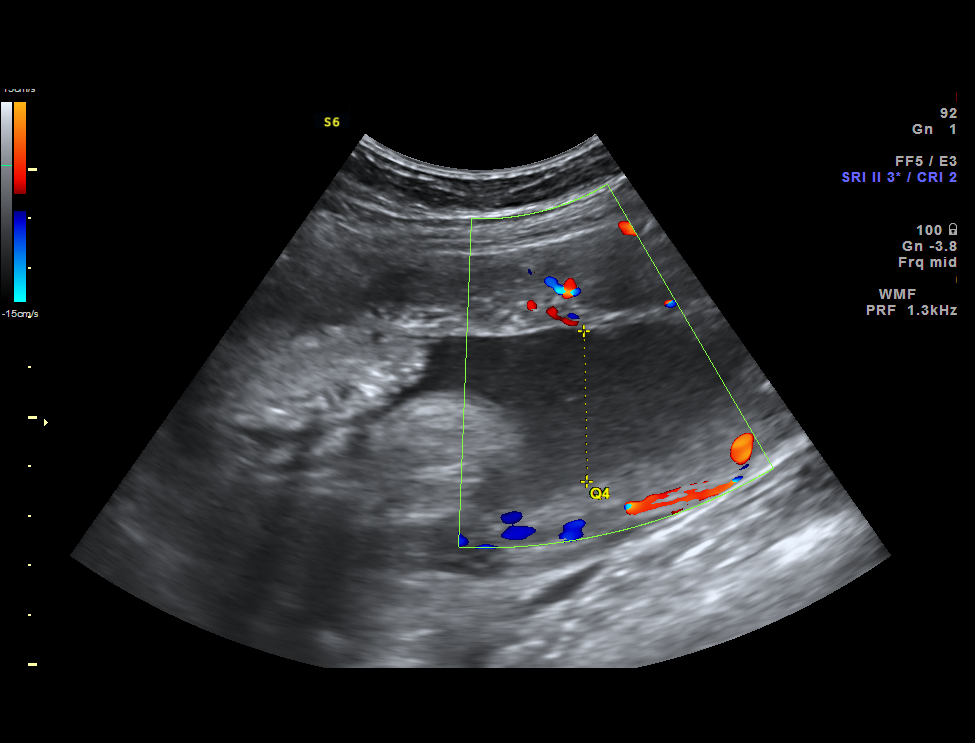
[im 7/7]
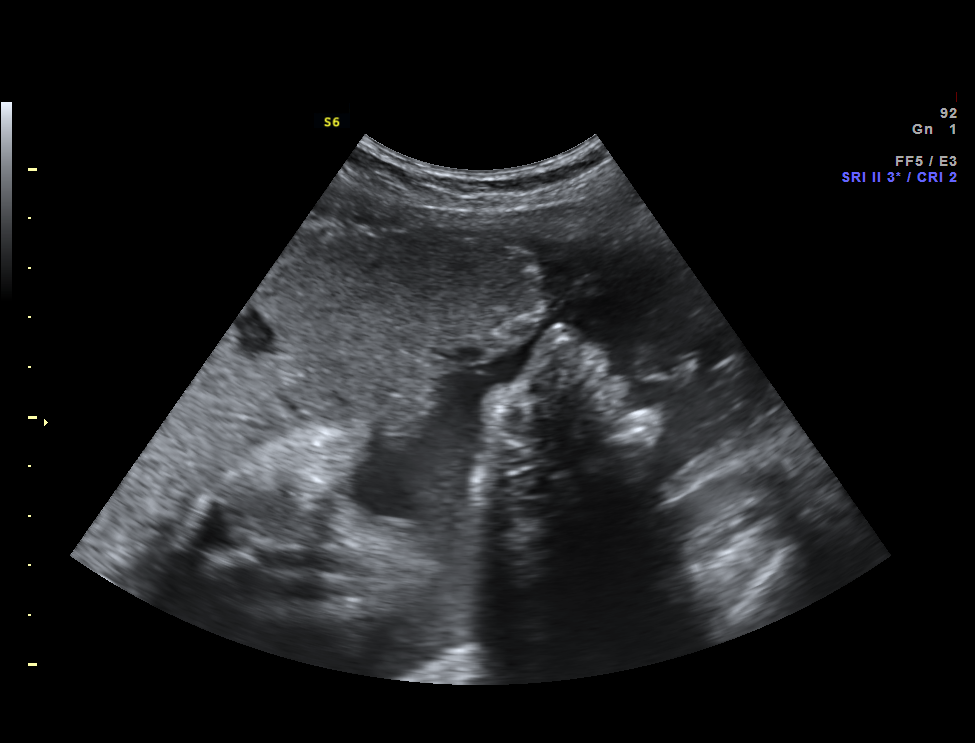

[7 of 7 positions shown; findings below may reference images not displayed]

Road [HOSPITAL]
[REDACTED]care

1  [HOSPITAL]                               [address].0

Indications

31 weeks gestation of pregnancy
Poor obstetric history: Previous fetal growth
restriction (FGR)

OB History

Blood Type:            Height:  5'5"   Weight (lb):  110       BMI:
Gravidity:    3         Term:   0        Prem:   0        SAB:   1
TOP:          0       Ectopic:  1        Living: 0
Fetal Evaluation

Num Of Fetuses:     1
Fetal Heart         137
Rate(bpm):
Cardiac Activity:   Observed
Presentation:       Cephalic

Amniotic Fluid
AFI FV:      Subjectively within normal limits

AFI Sum(cm)     %Tile       Largest Pocket(cm)
10.56           20

RUQ(cm)       RLQ(cm)       LUQ(cm)        LLQ(cm)
5.2
Gestational Age
LMP:           32w 3d        Date:  07/28/16                 EDD:   05/04/17
Best:          31w 3d     Det. By:  Previous Ultrasound      EDD:   05/11/17
(09/19/16)
Comments

Normal amniotic fluid volume.
Impression

Normal AFI
Recommendations

Normal AFI, cont antenatal testing

## 2020-04-22 ENCOUNTER — Encounter (HOSPITAL_COMMUNITY): Payer: Self-pay

## 2020-04-22 ENCOUNTER — Other Ambulatory Visit: Payer: Self-pay

## 2020-04-22 ENCOUNTER — Ambulatory Visit (HOSPITAL_COMMUNITY)
Admission: EM | Admit: 2020-04-22 | Discharge: 2020-04-22 | Disposition: A | Discharge status: Home / Self Care | Payer: Medicaid Other | Attending: Student | Admitting: Student

## 2020-04-22 DIAGNOSIS — W57XXXA Bitten or stung by nonvenomous insect and other nonvenomous arthropods, initial encounter: Secondary | ICD-10-CM

## 2020-04-22 DIAGNOSIS — S00261A Insect bite (nonvenomous) of right eyelid and periocular area, initial encounter: Secondary | ICD-10-CM

## 2020-04-22 MED ORDER — METHYLPREDNISOLONE SODIUM SUCC 125 MG IJ SOLR
60.0000 mg | Freq: Once | INTRAMUSCULAR | Status: AC
  Administered 2020-04-22: 60 mg via INTRAMUSCULAR

## 2020-04-22 MED ORDER — METHYLPREDNISOLONE SODIUM SUCC 125 MG IJ SOLR
INTRAMUSCULAR | Status: AC
  Filled 2020-04-22: qty 2

## 2020-04-22 NOTE — ED Provider Notes (Signed)
[facility]    CSN: [id number] Arrival date & time: 04/22/20  1009      History   Chief Complaint Chief Complaint  Patient presents with  . Facial Swelling    Eye    HPI ANGELYN OSTERBERG is a 30 y.o. female presenting with R eye issue. Medical history asthma, seasonal allergies.  States that she woke up during the night and noticed that her upper eyelids have become swollen throughout the night.  Notes small area of tenderness on her temple which she thinks is where a spider might have bit her.  States that her eye is getting a little bit more swollen as time goes on, but denies any other symptoms including  photophobia, foreign body sensation, eye redness, eye crusting in the morning, eye pain, eye pain with movement, injury to eye, vision changes, double vision, excessive tearing, burning eyes. Does not wear contacts or glasses.    HPI  Past Medical History:  Diagnosis Date  . Asthma    as a child  . Bronchitis   . Pneumonia    childhood  . Seasonal allergies     Patient Active Problem List   Diagnosis Date Noted  . IUGR (intrauterine growth restriction) affecting care of mother 04/20/2017  . NSVD (normal spontaneous vaginal delivery) 04/20/2017  . Fetal renal anomaly, single gestation 03/26/2017  . Poor fetal growth affecting management of mother in third trimester 03/19/2017  . Tobacco abuse 11/18/2016  . Supervision of high risk pregnancy, antepartum 10/21/2016    Past Surgical History:  Procedure Laterality Date  . NO PAST SURGERIES      OB History    Gravida  3   Para      Term      Preterm      AB  2   Living  0     SAB  1   IAB      Ectopic  1   Multiple      Live Births  0            Home Medications    Prior to Admission medications   Medication Sig Start Date End Date Taking? Authorizing Provider  drospirenone-ethinyl estradiol (YAZ,GIANVI,LORYNA) 3-0.02 MG tablet Take 1 tablet by mouth daily. 06/06/17   Adam Phenix, MD  ondansetron (ZOFRAN-ODT) 4 MG disintegrating tablet Take 1 tablet (4 mg total) by mouth every 8 (eight) hours as needed for nausea or vomiting. 03/01/19   Mardella Layman, MD  Prenat-FeAsp-Meth-FA-DHA w/o A (PRENATE PIXIE) 10-0.6-0.4-200 MG CAPS Take 1 tablet by mouth daily. 10/21/16   Roe Coombs, CNM    Family History Family History  Problem Relation Age of Onset  . Diabetes Mother   . Hypertension Mother   . Cancer Maternal Grandmother        Lung  . Diabetes Maternal Grandmother   . Cancer Maternal Grandfather        Lung  . Diabetes Maternal Grandfather   . Other Neg Hx     Social History Social History   Tobacco Use  . Smoking status: Former Smoker    Packs/day: 0.25    Types: Cigarettes    Quit date: 08/14/2016    Years since quitting: 3.6  . Smokeless tobacco: Never Used  Substance Use Topics  . Alcohol use: Yes    Alcohol/week: 1.0 standard drink    Types: 1 Cans of beer per week    Comment: not currently  . Drug  use: No    Types: Marijuana    Comment: not since preg     Allergies   Benadryl [diphenhydramine hcl] and Ibuprofen   Review of Systems Review of Systems  Eyes: Negative for photophobia, pain, discharge, redness, itching and visual disturbance.       R eyelid swelling  All other systems reviewed and are negative.    Physical Exam Triage Vital Signs ED Triage Vitals  Enc Vitals Group     BP      Pulse      Resp      Temp      Temp src      SpO2      Weight      Height      Head Circumference      Peak Flow      Pain Score      Pain Loc      Pain Edu?      Excl. in GC?    No data found.  Updated Vital Signs BP 124/84 (BP Location: Left Arm)   Pulse 88   Temp 98.2 F (36.8 C) (Oral)   Resp 18   LMP 04/21/2020 (Exact Date)   SpO2 98%   Visual Acuity Right Eye Distance: 20/40 Left Eye Distance: 20/40 Bilateral Distance: 20/40  Right Eye Near:   Left Eye Near:    Bilateral Near:     Physical  Exam Vitals reviewed.  Constitutional:      Appearance: Normal appearance.  HENT:     Head: Normocephalic and atraumatic.  Eyes:     General: Lids are everted, no foreign bodies appreciated. Vision grossly intact.        Right eye: No foreign body, discharge or hordeolum.        Left eye: No foreign body, discharge or hordeolum.     Conjunctiva/sclera:     Right eye: Right conjunctiva is not injected. No chemosis, exudate or hemorrhage.    Left eye: Left conjunctiva is not injected. No chemosis, exudate or hemorrhage.    Comments: R upper eyelid is diffusely swollen, without erythema tenderness warmth, discharge. EOMI, PERRLA. No conjunctival injection, discharge. Small 66mm area of erythema R temple. Vision grossly intact.  Cardiovascular:     Rate and Rhythm: Normal rate and regular rhythm.     Heart sounds: Normal heart sounds.  Pulmonary:     Effort: Pulmonary effort is normal.     Breath sounds: Normal breath sounds.  Neurological:     General: No focal deficit present.     Mental Status: She is alert and oriented to person, place, and time.  Psychiatric:        Mood and Affect: Mood normal.        Behavior: Behavior normal.        Thought Content: Thought content normal.        Judgment: Judgment normal.      UC Treatments / Results  Labs (all labs ordered are listed, but only abnormal results are displayed) Labs Reviewed - No data to display  EKG   Radiology No results found.  Procedures Procedures (including critical care time)  Medications Ordered in UC Medications  methylPREDNISolone sodium succinate (SOLU-MEDROL) 125 mg/2 mL injection 60 mg (60 mg Intramuscular Given 04/22/20 1111)    Initial Impression / Assessment and Plan / UC Course  I have reviewed the triage vital signs and the nursing notes.  Pertinent labs & imaging results that were available during  my care of the patient were reviewed by me and considered in my medical decision making (see chart  for details).     This patient is a 30 year old female presenting with insect bite right eyelid. Visual acuity intact..   Solu-Medrol administered today.  Recommended Zyrtec for symptomatic relief at home as she is allergic to Benadryl. ED return precautions discussed  Final Clinical Impressions(s) / UC Diagnoses   Final diagnoses:  Insect bite of right eyelid, initial encounter     Discharge Instructions     -Try Zyrtec at home for additional relief. -Seek additional immediate medical attention if your symptoms worsen despite treatment, like worsening of eyelid swelling, new redness, discharge, vision changes, blurred vision, eye pain with movement.    ED Prescriptions    None     PDMP not reviewed this encounter.   Rhys Martini, PA-C 04/22/20 1152

## 2020-04-22 NOTE — Discharge Instructions (Addendum)
-  Try Zyrtec at home for additional relief. -Seek additional immediate medical attention if your symptoms worsen despite treatment, like worsening of eyelid swelling, new redness, discharge, vision changes, blurred vision, eye pain with movement.

## 2020-04-22 NOTE — ED Triage Notes (Signed)
Pt presents with her right eye swollen and shut. She states she thinks a spider bit her. She states it has gotten worse since midnight.

## 2023-04-21 ENCOUNTER — Ambulatory Visit (INDEPENDENT_AMBULATORY_CARE_PROVIDER_SITE_OTHER)

## 2023-04-21 ENCOUNTER — Other Ambulatory Visit: Payer: Self-pay

## 2023-04-21 VITALS — BP 129/71 | HR 84 | Ht 65.0 in | Wt 113.5 lb

## 2023-04-21 DIAGNOSIS — N912 Amenorrhea, unspecified: Secondary | ICD-10-CM

## 2023-04-21 DIAGNOSIS — N926 Irregular menstruation, unspecified: Secondary | ICD-10-CM

## 2023-04-21 DIAGNOSIS — Z3202 Encounter for pregnancy test, result negative: Secondary | ICD-10-CM

## 2023-04-21 LAB — POCT PREGNANCY, URINE: Preg Test, Ur: NEGATIVE

## 2023-04-21 NOTE — Progress Notes (Signed)
 Here today for pregnancy confirmation. UPT in office today is negative. Patient denies any positive pregnancy test at home. Reports taking multiple pregnancy test and all was negative. States last period cycle was around 02/03/2023 and has not had a cycle since and was concerned. Not sexually active currently. Last intercourse was in November. Additionally, reports having hot flashes along with sweating throughout the night.   Patient would like to establish care in office and get scheduled for a GYN annual exam with pap smear. Last pap smear was in 2018. Front office to schedule patient.   Quintella Reichert, RN 04/21/2023  1:27 PM

## 2023-06-12 ENCOUNTER — Encounter: Payer: Self-pay | Admitting: Family Medicine

## 2023-06-12 ENCOUNTER — Other Ambulatory Visit (HOSPITAL_COMMUNITY)
Admission: RE | Admit: 2023-06-12 | Discharge: 2023-06-12 | Disposition: A | Discharge status: Home / Self Care | Source: Ambulatory Visit | Attending: Family Medicine | Admitting: Family Medicine

## 2023-06-12 ENCOUNTER — Ambulatory Visit: Admitting: Family Medicine

## 2023-06-12 ENCOUNTER — Other Ambulatory Visit: Payer: Self-pay

## 2023-06-12 VITALS — BP 129/78 | HR 98 | Ht 65.0 in | Wt 117.3 lb

## 2023-06-12 DIAGNOSIS — Z113 Encounter for screening for infections with a predominantly sexual mode of transmission: Secondary | ICD-10-CM | POA: Diagnosis present

## 2023-06-12 DIAGNOSIS — Z124 Encounter for screening for malignant neoplasm of cervix: Secondary | ICD-10-CM | POA: Diagnosis present

## 2023-06-12 DIAGNOSIS — Z720 Tobacco use: Secondary | ICD-10-CM

## 2023-06-12 DIAGNOSIS — F17210 Nicotine dependence, cigarettes, uncomplicated: Secondary | ICD-10-CM

## 2023-06-12 DIAGNOSIS — Z1331 Encounter for screening for depression: Secondary | ICD-10-CM

## 2023-06-12 DIAGNOSIS — Z3202 Encounter for pregnancy test, result negative: Secondary | ICD-10-CM | POA: Diagnosis not present

## 2023-06-12 DIAGNOSIS — Z01419 Encounter for gynecological examination (general) (routine) without abnormal findings: Secondary | ICD-10-CM

## 2023-06-12 DIAGNOSIS — Z01411 Encounter for gynecological examination (general) (routine) with abnormal findings: Secondary | ICD-10-CM

## 2023-06-12 DIAGNOSIS — N911 Secondary amenorrhea: Secondary | ICD-10-CM

## 2023-06-12 LAB — POCT PREGNANCY, URINE: Preg Test, Ur: NEGATIVE

## 2023-06-12 MED ORDER — SLYND 4 MG PO TABS: 1.0000 | ORAL_TABLET | Freq: Every day | ORAL | 3 refills | Status: DC

## 2023-06-12 MED ORDER — MEDROXYPROGESTERONE ACETATE 10 MG PO TABS: 10.0000 mg | ORAL_TABLET | Freq: Every day | ORAL | 1 refills | Status: DC

## 2023-06-12 NOTE — Patient Instructions (Signed)

## 2023-06-12 NOTE — Progress Notes (Signed)
 Subjective:     Nancy Michael is a 33 y.o. female and is here for a comprehensive physical exam. The patient reports problems - irregular cycles. Had no cycle x 3 months. Having hot flashes.   The following portions of the patient's history were reviewed and updated as appropriate: allergies, current medications, past family history, past medical history, past social history, past surgical history, and problem list.  Review of Systems Pertinent items noted in HPI and remainder of comprehensive ROS otherwise negative.   Objective:    BP 129/78   Pulse 98   Ht 5\' 5"  (1.651 m)   Wt 117 lb 4.8 oz (53.2 kg)   LMP 05/06/2023 (Within Days)   Breastfeeding No   BMI 19.52 kg/m  General appearance: alert, cooperative, and appears stated age Head: Normocephalic, without obvious abnormality, atraumatic Neck: no adenopathy, supple, symmetrical, trachea midline, and thyroid not enlarged, symmetric, no tenderness/mass/nodules Lungs: clear to auscultation bilaterally Breasts: normal appearance, no masses or tenderness Heart: regular rate and rhythm, S1, S2 normal, no murmur, click, rub or gallop Abdomen: soft, non-tender; bowel sounds normal; no masses,  no organomegaly Pelvic: cervix normal in appearance, external genitalia normal, no adnexal masses or tenderness, no cervical motion tenderness, uterus normal size, shape, and consistency, and vagina normal without discharge Extremities: extremities normal, atraumatic, no cyanosis or edema Pulses: 2+ and symmetric Skin: Skin color, texture, turgor normal. No rashes or lesions Lymph nodes: Cervical, supraclavicular, and axillary nodes normal. Neurologic: Grossly normal    Assessment:    Healthy female exam.      Plan:   Problem List Items Addressed This Visit       Unprioritized   Tobacco abuse   Consider smoking cessation      Secondary amenorrhea   Needs PRL, FSH, TSH and other labs. Does not appear to have PCOS or Premature ovarian  failure. Reports FPW only. To see if has full Medicaid to complete w/u.  Provera and then start Slynd .      Relevant Medications   medroxyPROGESTERone (PROVERA) 10 MG tablet   Drospirenone  (SLYND ) 4 MG TABS   Other Visit Diagnoses       Encounter for gynecological examination without abnormal finding    -  Primary     Screen for STD (sexually transmitted disease)       Relevant Orders   Cervicovaginal ancillary only   RPR+HBsAg+HCVAb+...     Screening for malignant neoplasm of cervix       Relevant Orders   Cytology - PAP     Positive screening for depression on 9-item Patient Health Questionnaire (PHQ-9)       Relevant Orders   Ambulatory referral to Integrated Behavioral Health         See After Visit Summary for Counseling Recommendations

## 2023-06-12 NOTE — Assessment & Plan Note (Signed)
 Needs PRL, FSH, TSH and other labs. Does not appear to have PCOS or Premature ovarian failure. Reports FPW only. To see if has full Medicaid to complete w/u.  Provera  and then start Slynd .

## 2023-06-12 NOTE — Assessment & Plan Note (Signed)
-   Consider smoking cessation.

## 2023-06-13 ENCOUNTER — Ambulatory Visit: Payer: Self-pay | Admitting: Family Medicine

## 2023-06-13 LAB — CERVICOVAGINAL ANCILLARY ONLY
Bacterial Vaginitis (gardnerella): NEGATIVE
Candida Glabrata: NEGATIVE
Candida Vaginitis: NEGATIVE
Chlamydia: NEGATIVE
Comment: NEGATIVE
Comment: NEGATIVE
Comment: NEGATIVE
Comment: NEGATIVE
Comment: NEGATIVE
Comment: NORMAL
Neisseria Gonorrhea: NEGATIVE
Trichomonas: NEGATIVE

## 2023-06-13 LAB — RPR+HBSAG+HCVAB+...
HIV Screen 4th Generation wRfx: NONREACTIVE
Hep C Virus Ab: NONREACTIVE
Hepatitis B Surface Ag: NEGATIVE
RPR Ser Ql: NONREACTIVE

## 2023-06-18 LAB — CYTOLOGY - PAP
Chlamydia: NEGATIVE
Comment: NEGATIVE
Comment: NEGATIVE
Comment: NORMAL
Diagnosis: UNDETERMINED — AB
High risk HPV: NEGATIVE
Neisseria Gonorrhea: NEGATIVE

## 2023-07-03 ENCOUNTER — Telehealth: Payer: Self-pay | Admitting: Clinical

## 2023-07-03 NOTE — Telephone Encounter (Signed)
Attempt call regarding referral; Left HIPPA-compliant message to call back Mikle Sternberg from [facility] at  [phone] (Ashelyn Mccravy's office).    

## 2023-07-10 ENCOUNTER — Ambulatory Visit: Admitting: Obstetrics and Gynecology

## 2023-10-28 ENCOUNTER — Other Ambulatory Visit: Payer: Self-pay

## 2023-10-28 ENCOUNTER — Ambulatory Visit (INDEPENDENT_AMBULATORY_CARE_PROVIDER_SITE_OTHER): Payer: Self-pay | Admitting: Obstetrics and Gynecology

## 2023-10-28 VITALS — BP 112/89 | HR 96 | Wt 117.0 lb

## 2023-10-28 DIAGNOSIS — N911 Secondary amenorrhea: Secondary | ICD-10-CM

## 2023-10-28 DIAGNOSIS — Z3202 Encounter for pregnancy test, result negative: Secondary | ICD-10-CM

## 2023-10-28 DIAGNOSIS — Z1331 Encounter for screening for depression: Secondary | ICD-10-CM

## 2023-10-28 LAB — POCT PREGNANCY, URINE: Preg Test, Ur: NEGATIVE

## 2023-10-28 MED ORDER — SLYND 4 MG PO TABS: 1.0000 | ORAL_TABLET | Freq: Every day | ORAL | 3 refills | Status: AC

## 2023-10-28 NOTE — Progress Notes (Signed)
 Obstetrics and Gynecology New Patient Evaluation  Appointment Date: 10/28/2023  OBGYN Clinic: [facility]   Primary Care Provider: No primary care provider on file.  Chief Complaint: secondary amenorrhea follow up   History of Present Illness: Nancy Michael is a 33 y.o.  [id number] (Patient's last menstrual period was 10/13/2023 (within days).), seen for the above chief complaint. Her past medical history is significant for BMI 19, tobacco abuse  Patient states that sometime in the last year her period became irregular. Prior to that, she had qmonth, regular periods, <1wk, not heavy or painful, but sometime in the last year, her periods became irregular to where they were once every 3-20m but were still <1wk and not heavy or painful. She denies starting any new meds or illness, surgeries, etc around this time  She saw Dr. Fredirick in late May of this year and was also endorsing hot flashes, which she still does, and lab work up recommended, but not done due to patient having family planning Medicaid. Provera  and then Slynd  recommended and prescribed.  Patient states she never took the Provera  but started the Slynd  and doesn't have regular periods with it but did have one that was still light and not heavy.    Review of Systems: Pertinent items are noted in HPI.   Patient Active Problem List   Diagnosis Date Noted   Secondary amenorrhea 06/12/2023   Tobacco abuse 11/18/2016    Past Medical History:  Past Medical History:  Diagnosis Date   Asthma    as a child   Bronchitis    IUGR (intrauterine growth restriction) affecting care of mother 04/20/2017   NSVD (normal spontaneous vaginal delivery) 04/20/2017   Pneumonia    childhood   Poor fetal growth affecting management of mother in third trimester 03/19/2017   Seasonal allergies    Supervision of high risk pregnancy, antepartum 10/21/2016   Clinic     [facility]    Prenatal Labs      Dating      U/S     Blood type: A/Positive/-- (10/08 1002)       Genetic Screen      AFP: Neg    NIPS:Mat21: neg    Antibody:Negative (10/08 1002)      Anatomic US     12-23-16 normal limited f/u us  ordered    Rubella: @RUBELLARESULTSCONSOLE @      GTT    Third trimester: WNL    RPR: Non Reactive (01/30 1040)       Flu vaccine     Declined    HBsAg: Negative (   Past Surgical History:  Past Surgical History:  Procedure Laterality Date   NO PAST SURGERIES     Past Obstetrical History:  OB History  Gravida Para Term Preterm AB Living  3 1 1  2 1   SAB IAB Ectopic Multiple Live Births  1  1  1     # Outcome Date GA Lbr Len/2nd Weight Sex Type Anes PTL Lv  3 Term 04/20/17 [redacted]w[redacted]d  4 lb 6 oz (1.984 kg) F    LIV  2 SAB           1 Ectopic            Past Gynecological History: As per HPI. History of Pap Smear(s): Yes.   Last pap may 2025, which was ascus/hpv negative  Social History:  Social History   Socioeconomic History   Marital status: Single    Spouse  name: Not on file   Number of children: Not on file   Years of education: Not on file   Highest education level: Not on file  Occupational History   Occupation: supply    Employer: [employer]  Tobacco Use   Smoking status: Every Day    Current packs/day: 0.25    Types: Cigarettes   Smokeless tobacco: Never  Vaping Use   Vaping status: Never Used  Substance and Sexual Activity   Alcohol use: Yes    Alcohol/week: 1.0 standard drink of alcohol    Types: 1 Cans of beer per week    Comment: not currently   Drug use: Not Currently    Types: Marijuana    Comment: not since preg   Sexual activity: Yes    Birth control/protection: None  Other Topics Concern   Not on file  Social History Narrative   Not on file   Social Drivers of Health   Financial Resource Strain: Not on file  Food Insecurity: Food Insecurity Present (06/12/2023)   Hunger Vital Sign    Worried About Running Out of Food in the Last Year: Sometimes true    Ran Out of Food  in the Last Year: Sometimes true  Transportation Needs: No Transportation Needs (06/12/2023)   PRAPARE - Administrator, Civil Service (Medical): No    Lack of Transportation (Non-Medical): No  Physical Activity: Not on file  Stress: Not on file  Social Connections: Not on file  Intimate Partner Violence: Not on file   Family History:  Family History  Problem Relation Age of Onset   Diabetes Mother    Hypertension Mother    Cancer Maternal Grandmother        Lung   Diabetes Maternal Grandmother    Cancer Maternal Grandfather        Lung   Diabetes Maternal Grandfather    Other Neg Hx     Medications [name] had no medications administered during this visit. Current Outpatient Medications  Medication Sig Dispense Refill   Drospirenone  (SLYND ) 4 MG TABS Take 1 tablet (4 mg total) by mouth daily. 90 tablet 3   No current facility-administered medications for this visit.   Allergies Benadryl  [diphenhydramine  hcl] and Ibuprofen   Physical Exam:  BP 112/89   Pulse 96   Wt 117 lb (53.1 kg)   LMP 10/13/2023 (Within Days)   BMI 19.47 kg/m  Body mass index is 19.47 kg/m. General appearance: Well nourished, well developed female in no acute distress.  Neck:  Supple, normal appearance, and no thyromegaly  Respiratory:  Normal respiratory effort Neuro/Psych:  Normal mood and affect.    Laboratory: UPT negative  Radiology: none  Assessment: patient stable  Plan:  1. Secondary amenorrhea (Primary) I d/w her that I do also recommend lab work up, but she declines due to her insurance. I told her that if she does get on regular medicaid or decides to get labs as self pay that I recommend to let us  know because I recommend she stop the pills for about 4-6wks before doing the labwork. I recommend AMH, fsh, estradiol , tsh, fasting prolactin, total and free testosterone and 17ohp with quant hcg  I also d/w her doing cyclic provera  instead of Slynd  but she prefers  the Slynd   I also d/w her re: her weight. Weight stable at 117lbs and she states this is actually higher than her baseline which is around 110lbs; she states she doesn't exercise and  stress level is low. I told her that gaining weight may help her periods come back to normal, as well, due to age related decline in ovarian reserve.  - Drospirenone  (SLYND ) 4 MG TABS; Take 1 tablet (4 mg total) by mouth daily.  Dispense: 90 tablet; Refill: 3  Orders Placed This Encounter  Procedures   Pregnancy, urine POC     Return in about 1 year (around 10/27/2024), or if symptoms worsen or fail to improve.  No future appointments.  Bebe Izell Raddle MD Attending [facility])
